# Patient Record
Sex: Male | Born: 1990 | Race: Black or African American | Hispanic: No | Smoking: Current every day smoker
Health system: Southern US, Community
[De-identification: ages and names within clinical notes are randomized; demographics above are authoritative.]

## PROBLEM LIST (undated history)

## (undated) DIAGNOSIS — F32A Depression, unspecified: Secondary | ICD-10-CM

## (undated) DIAGNOSIS — R040 Epistaxis: Secondary | ICD-10-CM

## (undated) DIAGNOSIS — F329 Major depressive disorder, single episode, unspecified: Secondary | ICD-10-CM

## (undated) DIAGNOSIS — R079 Chest pain, unspecified: Secondary | ICD-10-CM

## (undated) DIAGNOSIS — F1010 Alcohol abuse, uncomplicated: Secondary | ICD-10-CM

## (undated) HISTORY — PX: SKIN DEBRIDEMENT: SHX5235

---
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
## 2009-09-08 ENCOUNTER — Emergency Department (HOSPITAL_COMMUNITY): Admission: EM | Admit: 2009-09-08 | Discharge: 2009-09-08 | Payer: Self-pay | Admitting: Emergency Medicine

## 2012-04-23 ENCOUNTER — Emergency Department (HOSPITAL_COMMUNITY)
Admission: EM | Admit: 2012-04-23 | Discharge: 2012-04-23 | Disposition: A | Discharge status: Home / Self Care | Payer: Self-pay | Attending: Emergency Medicine | Admitting: Emergency Medicine

## 2012-04-23 ENCOUNTER — Emergency Department (HOSPITAL_COMMUNITY): Payer: Self-pay

## 2012-04-23 ENCOUNTER — Encounter (HOSPITAL_COMMUNITY): Payer: Self-pay | Admitting: *Deleted

## 2012-04-23 DIAGNOSIS — Y92410 Unspecified street and highway as the place of occurrence of the external cause: Secondary | ICD-10-CM | POA: Insufficient documentation

## 2012-04-23 DIAGNOSIS — S8990XA Unspecified injury of unspecified lower leg, initial encounter: Secondary | ICD-10-CM | POA: Insufficient documentation

## 2012-04-23 DIAGNOSIS — Y9389 Activity, other specified: Secondary | ICD-10-CM | POA: Insufficient documentation

## 2012-04-23 DIAGNOSIS — S6990XA Unspecified injury of unspecified wrist, hand and finger(s), initial encounter: Secondary | ICD-10-CM | POA: Insufficient documentation

## 2012-04-23 DIAGNOSIS — S99929A Unspecified injury of unspecified foot, initial encounter: Secondary | ICD-10-CM | POA: Insufficient documentation

## 2012-04-23 DIAGNOSIS — F1010 Alcohol abuse, uncomplicated: Secondary | ICD-10-CM | POA: Insufficient documentation
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
## 2012-07-07 ENCOUNTER — Encounter (HOSPITAL_COMMUNITY): Payer: Self-pay | Admitting: *Deleted

## 2012-07-07 ENCOUNTER — Emergency Department (HOSPITAL_COMMUNITY)
Admission: EM | Admit: 2012-07-07 | Discharge: 2012-07-07 | Disposition: A | Discharge status: Home / Self Care | Payer: Self-pay | Attending: Emergency Medicine | Admitting: Emergency Medicine

## 2012-07-07 ENCOUNTER — Emergency Department (HOSPITAL_COMMUNITY): Payer: Self-pay

## 2012-07-07 DIAGNOSIS — R071 Chest pain on breathing: Secondary | ICD-10-CM | POA: Insufficient documentation

## 2012-07-07 DIAGNOSIS — R21 Rash and other nonspecific skin eruption: Secondary | ICD-10-CM | POA: Insufficient documentation

## 2012-07-07 DIAGNOSIS — R0789 Other chest pain: Secondary | ICD-10-CM

## 2012-07-07 DIAGNOSIS — R0602 Shortness of breath: Secondary | ICD-10-CM | POA: Insufficient documentation

## 2012-07-07 DIAGNOSIS — F17200 Nicotine dependence, unspecified, uncomplicated: Secondary | ICD-10-CM | POA: Insufficient documentation

## 2012-07-07 HISTORY — DX: Chest pain, unspecified: R07.9

## 2012-07-07 HISTORY — DX: Epistaxis: R04.0

## 2012-07-07 LAB — POCT I-STAT, CHEM 8
Calcium, Ion: 1.19 mmol/L (ref 1.12–1.23)
Glucose, Bld: 144 mg/dL — ABNORMAL HIGH (ref 70–99)
HCT: 42 % (ref 39.0–52.0)
Hemoglobin: 14.3 g/dL (ref 13.0–17.0)

## 2012-07-07 LAB — CBC
HCT: 38.5 % — ABNORMAL LOW (ref 39.0–52.0)
Hemoglobin: 13.5 g/dL (ref 13.0–17.0)
MCH: 31.5 pg (ref 26.0–34.0)
MCHC: 35.1 g/dL (ref 30.0–36.0)
MCV: 90 fL (ref 78.0–100.0)
Platelets: 254 10*3/uL (ref 150–400)
RBC: 4.28 MIL/uL (ref 4.22–5.81)
RDW: 13.3 % (ref 11.5–15.5)
WBC: 11.1 10*3/uL — ABNORMAL HIGH (ref 4.0–10.5)

## 2012-07-07 LAB — POCT I-STAT TROPONIN I

## 2012-07-07 MED ORDER — DIPHENHYDRAMINE HCL 25 MG PO CAPS
25.0000 mg | ORAL_CAPSULE | Freq: Once | ORAL | Status: AC
  Administered 2012-07-07: 25 mg via ORAL
  Filled 2012-07-07: qty 1

## 2012-07-07 MED ORDER — HYDROXYZINE HCL 25 MG PO TABS: 25.0000 mg | ORAL_TABLET | Freq: Four times a day (QID) | ORAL | Status: DC

## 2012-07-07 MED ORDER — IBUPROFEN 400 MG PO TABS
600.0000 mg | ORAL_TABLET | Freq: Once | ORAL | Status: AC
  Administered 2012-07-07: 600 mg via ORAL
  Filled 2012-07-07: qty 1

## 2012-07-07 NOTE — ED Notes (Signed)
Pt with hx of chest pain "for years" to ED c/o increased pain last night.  Pain shoots from R to L or "shoots in sternum".  Felt like he was "gasping for air" last night, but today no c/o sob.  Also c/o rash/bites to forearms and ankles.  Did notice rash after sleeping at friends house last week.

## 2012-07-09 NOTE — ED Provider Notes (Signed)
History    -year-old male with chest pain. Patient has been having similar pain intermittently for the past several years. Patient states the pain "shoots across his chest." Surgeon the right parasternal border and axilla. Lasts seconds. Stenosis history with shortness of breath. No appreciable exacerbating relieving factors. No fevers or chills. No cough. No unusual leg pain or swelling. No dizziness, lightheadedness or palpitations. Denies any drug use aside from occasional marijuana. He is a smoker. Is also complaining of a pruritic rash primarily in his bilateral forearms and hands but is also noticed on his upper chest and lower back. No new exposures that he is aware of aside from significant friend's house last week. No difficulty breathing or swallowing. CSN: [id number]  Arrival date & time 07/07/12  1235   First MD Initiated Contact with Patient 07/07/12 1402      Chief Complaint  Patient presents with  . Chest Pain  . Rash    (Consider location/radiation/quality/duration/timing/severity/associated sxs/prior treatment) HPI  Past Medical History  Diagnosis Date  . Epistaxis   . Chest pain     History reviewed. No pertinent past surgical history.  No family history on file.  History  Substance Use Topics  . Smoking status: Current Every Day Smoker -- 0.25 packs/day    Types: Cigarettes  . Smokeless tobacco: Not on file  . Alcohol Use: Yes     Comment: weekend drinker      Review of Systems  All systems reviewed and negative, other than as noted in HPI.   Allergies  Review of patient's allergies indicates no known allergies.  Home Medications   Current Outpatient Rx  Name  Route  Sig  Dispense  Refill  . hydrOXYzine (ATARAX/VISTARIL) 25 MG tablet   Oral   Take 1 tablet (25 mg total) by mouth every 6 (six) hours.   12 tablet   0     BP 121/69  Pulse 77  Temp(Src) 97.8 F (36.6 C) (Oral)  Resp 18  SpO2 100%  Physical Exam  Nursing note and vitals  reviewed. Constitutional: He appears well-developed and well-nourished. No distress.  HENT:  Head: Normocephalic and atraumatic.  Eyes: Conjunctivae are normal. Right eye exhibits no discharge. Left eye exhibits no discharge.  Neck: Neck supple.  Cardiovascular: Normal rate, regular rhythm and normal heart sounds.  Exam reveals no gallop and no friction rub.   No murmur heard. Pulmonary/Chest: Effort normal and breath sounds normal. No respiratory distress. He exhibits tenderness.  Tenderness in the right parasternal border and across his mid to upper sternum. No crepitus. No concerning skin lesions.  Abdominal: Soft. He exhibits no distension. There is no tenderness.  Musculoskeletal: He exhibits no edema and no tenderness.  Lower extremities symmetric as compared to each other. No calf tenderness. Negative Homan's. No palpable cords.   Neurological: He is alert.  Skin: Skin is warm and dry.  Psychiatric: He has a normal mood and affect. His behavior is normal. Thought content normal.    ED Course  Procedures (including critical care time)  Labs Reviewed  CBC - Abnormal; Notable for the following:    WBC 11.1 (*)    HCT 38.5 (*)    All other components within normal limits  POCT I-STAT, CHEM 8 - Abnormal; Notable for the following:    BUN 27 (*)    Glucose, Bld 144 (*)    All other components within normal limits  POCT I-STAT TROPONIN I   No results found.  Dg  Chest 2 View  07/07/2012  *RADIOLOGY REPORT*  Clinical Data: Right mid chest pain, shortness of breath, dizziness and nausea  CHEST - 2 VIEW  Comparison: None.  Findings: No active infiltrate or effusion is seen. The lung fissures are somewhat more visible than generally seen and mild fluid overload cannot be excluded.  Mediastinal contours appear normal.  The heart is within normal limits in size. No bony abnormality is seen.  IMPRESSION: No definite active process.  Slightly prominent fissures.  Cannot exclude mild fluid  overload.   Original Report Authenticated By: Dwyane Dee, M.D.    EKG:  Rhythm: This rhythm with sinus arrhythmia Vent. rate 77 BPM PR interval 120 ms QRS duration 96 ms QT/QTc 360/407 ms ST segments: Specific ST change  1. Chest wall pain   2. Rash       MDM  22 year old male with chest pain. I personally reviewed the patient's EKG and chest x-ray as well as the nursing note. Suspect chest wall pain. Very atypical ACS. Doubt pulmonary embolism. Doubt infectious. Doubt dissection. Chest x-ray unremarkable. Patient counseled about smoking cessation. Also with a patchy, erythematous nonspecific rash. Pruritic. Possibly mild allergic reaction. Given his hemodynamic instability or airway compromise. Will treatment symptomatically at this time.        Raeford Razor, MD 07/09/12 1434

## 2013-05-19 ENCOUNTER — Encounter (HOSPITAL_COMMUNITY): Payer: Self-pay | Admitting: Emergency Medicine

## 2013-05-19 ENCOUNTER — Emergency Department (HOSPITAL_COMMUNITY)
Admission: EM | Admit: 2013-05-19 | Discharge: 2013-05-19 | Disposition: A | Discharge status: Home / Self Care | Payer: Self-pay | Attending: Emergency Medicine | Admitting: Emergency Medicine

## 2013-05-19 DIAGNOSIS — R51 Headache: Secondary | ICD-10-CM | POA: Insufficient documentation

## 2013-05-19 DIAGNOSIS — K0889 Other specified disorders of teeth and supporting structures: Secondary | ICD-10-CM

## 2013-05-19 DIAGNOSIS — F17200 Nicotine dependence, unspecified, uncomplicated: Secondary | ICD-10-CM | POA: Insufficient documentation

## 2013-05-19 DIAGNOSIS — K029 Dental caries, unspecified: Secondary | ICD-10-CM | POA: Insufficient documentation

## 2013-05-19 DIAGNOSIS — R519 Headache, unspecified: Secondary | ICD-10-CM

## 2013-05-19 DIAGNOSIS — Z792 Long term (current) use of antibiotics: Secondary | ICD-10-CM | POA: Insufficient documentation

## 2013-05-19 DIAGNOSIS — K089 Disorder of teeth and supporting structures, unspecified: Secondary | ICD-10-CM | POA: Insufficient documentation

## 2013-05-19 DIAGNOSIS — Z791 Long term (current) use of non-steroidal anti-inflammatories (NSAID): Secondary | ICD-10-CM | POA: Insufficient documentation

## 2013-05-19 DIAGNOSIS — R599 Enlarged lymph nodes, unspecified: Secondary | ICD-10-CM | POA: Insufficient documentation

## 2013-05-19 MED ORDER — PENICILLIN V POTASSIUM 250 MG PO TABS
500.0000 mg | ORAL_TABLET | Freq: Once | ORAL | Status: AC
Start: 2013-05-19 — End: 2013-05-19
  Administered 2013-05-19: 500 mg via ORAL
  Filled 2013-05-19: qty 2

## 2013-05-19 MED ORDER — PENICILLIN V POTASSIUM 500 MG PO TABS: 500.0000 mg | ORAL_TABLET | Freq: Four times a day (QID) | ORAL | Status: DC

## 2013-05-19 MED ORDER — TRAMADOL HCL 50 MG PO TABS: 50.0000 mg | ORAL_TABLET | Freq: Four times a day (QID) | ORAL | Status: DC | PRN

## 2013-05-19 MED ORDER — NAPROXEN 500 MG PO TABS: 500.0000 mg | ORAL_TABLET | Freq: Two times a day (BID) | ORAL | Status: DC

## 2013-05-19 MED ORDER — KETOROLAC TROMETHAMINE 60 MG/2ML IM SOLN
60.0000 mg | Freq: Once | INTRAMUSCULAR | Status: AC
  Administered 2013-05-19: 60 mg via INTRAMUSCULAR
  Filled 2013-05-19: qty 2

## 2013-05-19 NOTE — Discharge Instructions (Signed)
Headache:  You are having a headache. No specific cause was found today for your headache. It may have been a migraine or other cause of headache. Stress, anxiety, fatigue, and depression are common triggers for headaches. Your headache today does not appear to be life-threatening or require hospitalization, but often the exact cause of headaches is not determined in the emergency department. Therefore, followup with your doctor is very important to find out what may have caused your headache, and whether or not you need any further diagnostic testing or treatment. Sometimes headaches can appear benign but then more serious symptoms can develop which should prompt an immediate reevaluation by your doctor or the emergency department.  Seek immediate medical attention if:  You develop possible problems with medications prescribed. The medications don't resolve your headache, if it recurs, or if you have multiple episodes of vomiting or can't take fluids by mouth You have a change from the usual headache. If you developed a sudden severe headache or confusion, become poorly responsive or faint, developed a fever above 100.4 or problems breathing, have a change in speech, vision, swallowing or understanding, or developed new weakness, numbness, tingling, incoordination or have a seizure.  If you don't have a family doctor to follow up with, see the follow up list below - call this morning for a follow-up appointment in the next 1-2 days.  RESOURCE GUIDE  Dental Problems  Patients with Medicaid: [facility]                     [facility] [address]                                           [address] Phone:  [phone]                                                  Phone:  [phone]  If unable to pay or uninsured, contact:  [facility] or [facility] to become qualified for the adult dental clinic.  Chronic Pain Problems Contact [place]  [phone] Patients need to be referred by their primary care doctor.  Insufficient Money for Medicine Contact [employer]:  call "211" or [facility] 271-5999.  No Primary Care Doctor Call [facility]  832-8000 Other agencies that provide inexpensive medical care    [facility]  [phone]    [facility]  [phone]    [facility]  [phone]    [facility] Clinic  832-4777    [facility]  [phone]    [facility]  [phone]  Psychological Services [facility]  [phone] [facility]  [phone] [facility]   800 853-5163 (emergency services 641-4993)  Substance Abuse Resources [facility]  336-882-2125 [facility] 336-784-9470 [facility] 336-285-9073 [facility] 336-845-3988 Residential & Outpatient Substance Abuse Program  800-659-3381  Abuse/Neglect [place] Child Abuse Hotline (336) 641-3795 [place] Child Abuse Hotline 800-378-5315 (After Hours)  Emergency Shelter [facility] (336) 271-5985  [facility] (336) 275-9566 [facility] (704) 372-4663  MRSA Hotline #:     832-7006    [place] Resources  [facility] [place]     [employer]                          [facility] [address] [place]                       [address]      [address]  [place]                                                [place]                            [place] Phone:  [phone]                                   Phone:  [phone]                 Phone:  [phone]  [facility] Phone:  [phone]  [place] Child Abuse Hotline (336) 342-1394 (336) 342-3537 (After Hours)   

## 2013-05-19 NOTE — ED Notes (Signed)
Pt. C/o HA x3 days that radiates down right side of face. Pt. Denies sensitivity to light or blurred vision. Alert and oriented x4. Hx of migraines.

## 2013-05-19 NOTE — ED Provider Notes (Signed)
CSN: [id number]     Arrival date & time 05/19/13  0015 History   First MD Initiated Contact with Patient 05/19/13 [phone]     Chief Complaint  Patient presents with  . Headache   (Consider location/radiation/quality/duration/timing/severity/associated sxs/prior Treatment) HPI Comments: 23 year old male, presents with a complaint of right-sided headache for the last 3 days, this is a sharp and shooting pain that is located in his lower jaw, radiates up into the temporal muscle. Worse with palpation, worse with chewing, no difficulty with opening the jaw or swallowing or breathing. The symptoms are persistent, gradually worsening, were gradual in onset. He felt as though his lower rear molar was causing the pain, he had deep dental caries, he had his girlfriend to go to the store and place a homemade filling in that tooth. This has not helped his pain.  No vomiting, fevers or stiff neck.  Patient is a 23 y.o. male presenting with headaches. The history is provided by the patient.  Headache Associated symptoms: no drainage, no fever, no neck pain, no neck stiffness, no sinus pressure and no sore throat     Past Medical History  Diagnosis Date  . Epistaxis   . Chest pain    History reviewed. No pertinent past surgical history. No family history on file. History  Substance Use Topics  . Smoking status: Current Every Day Smoker -- 0.25 packs/day    Types: Cigarettes  . Smokeless tobacco: Not on file  . Alcohol Use: Yes     Comment: weekend drinker    Review of Systems  Constitutional: Negative for fever.  HENT: Positive for dental problem. Negative for facial swelling, postnasal drip, rhinorrhea, sinus pressure and sore throat.   Musculoskeletal: Negative for neck pain and neck stiffness.  Neurological: Positive for headaches.    Allergies  Review of patient's allergies indicates no known allergies.  Home Medications   Current Outpatient Rx  Name  Route  Sig  Dispense  Refill  .  acetaminophen (TYLENOL) 500 MG tablet   Oral   Take 1,000 mg by mouth every 6 (six) hours as needed for headache.         . naproxen (NAPROSYN) 500 MG tablet   Oral   Take 1 tablet (500 mg total) by mouth 2 (two) times daily with a meal.   30 tablet   0   . penicillin v potassium (VEETID) 500 MG tablet   Oral   Take 1 tablet (500 mg total) by mouth 4 (four) times daily.   40 tablet   0   . traMADol (ULTRAM) 50 MG tablet   Oral   Take 1 tablet (50 mg total) by mouth every 6 (six) hours as needed.   15 tablet   0    BP 127/74  Pulse 79  Temp(Src) 98.5 F (36.9 C) (Oral)  Resp 14  Ht 5\' 6"  (1.676 m)  Wt 142 lb (64.411 kg)  BMI 22.93 kg/m2  SpO2 98% Physical Exam  Nursing note and vitals reviewed. Constitutional: He appears well-developed and well-nourished. No distress.  HENT:  Head: Normocephalic and atraumatic.  Mouth/Throat: Oropharynx is clear and moist. No oropharyngeal exudate.  Right rear lower molar with significant tenderness, there is a homemade filling placed in the tooth, there is no surrounding gingival abscesses. Tympanic membrane and external auditory canal and external auricular structures are normal on the right. Reproducible tenderness to palpation over the right masseter and temporalis muscles, mild tenderness over the right mandible, no swelling  or asymmetry, no tenderness under the tongue, no elevation or protrusion of the tongue  Eyes: Conjunctivae are normal. No scleral icterus.  Neck: Normal range of motion. Neck supple. No thyromegaly present.  Cardiovascular: Normal rate and regular rhythm.   Pulmonary/Chest: Effort normal and breath sounds normal.  Lymphadenopathy:    He has cervical adenopathy (mild right-sided anterior cervical lymphadenopathy).  Neurological: He is alert.  Skin: Skin is warm and dry. No rash noted. He is not diaphoretic.    ED Course  Procedures (including critical care time) Labs Review Labs Reviewed - No data to  display Imaging Review No results found.  EKG Interpretation   None       MDM   1. Headache   2. Toothache    Overall the patient is well-appearing though he does have dental tenderness which has caused a resultant headache. He will be treated with pain medications, antibiotics, he appears stable for discharge. I doubt that this is a pathologic headache, dental followup as outpatient.   Meds given in ED:  Medications  ketorolac (TORADOL) injection 60 mg (not administered)  penicillin v potassium (VEETID) tablet 500 mg (not administered)    New Prescriptions   NAPROXEN (NAPROSYN) 500 MG TABLET    Take 1 tablet (500 mg total) by mouth 2 (two) times daily with a meal.   PENICILLIN V POTASSIUM (VEETID) 500 MG TABLET    Take 1 tablet (500 mg total) by mouth 4 (four) times daily.   TRAMADOL (ULTRAM) 50 MG TABLET    Take 1 tablet (50 mg total) by mouth every 6 (six) hours as needed.        Vida RollerBrian D Jack Bolio, MD 05/19/13 0300

## 2013-05-19 NOTE — ED Notes (Signed)
Pt. reports headache radiating to right side of face for 3 days , denies injury , no fever or chills. Alert and oriented /respirations unlabored. Denies nausea .

## 2013-07-16 ENCOUNTER — Encounter (HOSPITAL_COMMUNITY): Payer: Self-pay | Admitting: Emergency Medicine

## 2013-07-16 ENCOUNTER — Inpatient Hospital Stay (HOSPITAL_COMMUNITY)
Admission: AD | Admit: 2013-07-16 | Discharge: 2013-07-19 | DRG: 897 | Disposition: A | Discharge status: Home / Self Care | Payer: Federal, State, Local not specified - Other | Source: Intra-hospital | Attending: Psychiatry | Admitting: Psychiatry

## 2013-07-16 ENCOUNTER — Encounter (HOSPITAL_COMMUNITY): Payer: Self-pay | Admitting: *Deleted

## 2013-07-16 ENCOUNTER — Emergency Department (HOSPITAL_COMMUNITY)
Admission: EM | Admit: 2013-07-16 | Discharge: 2013-07-16 | Disposition: A | Discharge status: Other Acute Inpatient Hospital | Payer: Self-pay | Attending: Emergency Medicine | Admitting: Emergency Medicine

## 2013-07-16 DIAGNOSIS — F1210 Cannabis abuse, uncomplicated: Secondary | ICD-10-CM | POA: Diagnosis present

## 2013-07-16 DIAGNOSIS — Z791 Long term (current) use of non-steroidal anti-inflammatories (NSAID): Secondary | ICD-10-CM | POA: Insufficient documentation

## 2013-07-16 DIAGNOSIS — F3289 Other specified depressive episodes: Secondary | ICD-10-CM | POA: Diagnosis present

## 2013-07-16 DIAGNOSIS — R45850 Homicidal ideations: Secondary | ICD-10-CM | POA: Insufficient documentation

## 2013-07-16 DIAGNOSIS — R45851 Suicidal ideations: Secondary | ICD-10-CM | POA: Insufficient documentation

## 2013-07-16 DIAGNOSIS — F329 Major depressive disorder, single episode, unspecified: Secondary | ICD-10-CM | POA: Insufficient documentation

## 2013-07-16 DIAGNOSIS — F10129 Alcohol abuse with intoxication, unspecified: Secondary | ICD-10-CM

## 2013-07-16 DIAGNOSIS — Z792 Long term (current) use of antibiotics: Secondary | ICD-10-CM | POA: Insufficient documentation

## 2013-07-16 DIAGNOSIS — F332 Major depressive disorder, recurrent severe without psychotic features: Secondary | ICD-10-CM | POA: Diagnosis present

## 2013-07-16 DIAGNOSIS — F17200 Nicotine dependence, unspecified, uncomplicated: Secondary | ICD-10-CM | POA: Insufficient documentation

## 2013-07-16 DIAGNOSIS — F32A Depression, unspecified: Secondary | ICD-10-CM

## 2013-07-16 DIAGNOSIS — F1020 Alcohol dependence, uncomplicated: Principal | ICD-10-CM | POA: Diagnosis present

## 2013-07-16 HISTORY — DX: Major depressive disorder, single episode, unspecified: F32.9

## 2013-07-16 HISTORY — DX: Depression, unspecified: F32.A

## 2013-07-16 LAB — RAPID URINE DRUG SCREEN, HOSP PERFORMED
AMPHETAMINES: NOT DETECTED
BENZODIAZEPINES: NOT DETECTED
Barbiturates: NOT DETECTED
COCAINE: NOT DETECTED
OPIATES: NOT DETECTED
TETRAHYDROCANNABINOL: POSITIVE — AB

## 2013-07-16 LAB — COMPREHENSIVE METABOLIC PANEL
ALK PHOS: 65 U/L (ref 39–117)
ALT: 12 U/L (ref 0–53)
AST: 20 U/L (ref 0–37)
Albumin: 4.7 g/dL (ref 3.5–5.2)
BUN: 10 mg/dL (ref 6–23)
CALCIUM: 9.5 mg/dL (ref 8.4–10.5)
CHLORIDE: 103 meq/L (ref 96–112)
CO2: 23 mEq/L (ref 19–32)
CREATININE: 0.76 mg/dL (ref 0.50–1.35)
GFR calc Af Amer: >90 mL/min (ref >90)
GFR calc non Af Amer: >90 mL/min (ref >90)
Glucose, Bld: 92 mg/dL (ref 70–99)
Potassium: 4 mEq/L (ref 3.7–5.3)
Sodium: 143 mEq/L (ref 137–147)
Total Protein: 8.3 g/dL (ref 6.0–8.3)

## 2013-07-16 LAB — CBC
HEMATOCRIT: 43.9 % (ref 39.0–52.0)
HEMOGLOBIN: 15.5 g/dL (ref 13.0–17.0)
MCH: 32.6 pg (ref 26.0–34.0)
MCHC: 35.3 g/dL (ref 30.0–36.0)
MCV: 92.2 fL (ref 78.0–100.0)
PLATELETS: 256 10*3/uL (ref 150–400)
RBC: 4.76 MIL/uL (ref 4.22–5.81)
RDW: 13.3 % (ref 11.5–15.5)
WBC: 16.2 10*3/uL — ABNORMAL HIGH (ref 4.0–10.5)

## 2013-07-16 LAB — ACETAMINOPHEN LEVEL

## 2013-07-16 LAB — ETHANOL: Alcohol, Ethyl (B): 197 mg/dL — ABNORMAL HIGH (ref 0–11)

## 2013-07-16 LAB — SALICYLATE LEVEL

## 2013-07-16 MED ORDER — LORAZEPAM 2 MG/ML IJ SOLN: 0.0000 mg | Freq: Four times a day (QID) | INTRAMUSCULAR | Status: DC

## 2013-07-16 MED ORDER — SODIUM CHLORIDE 0.9 % IV BOLUS (SEPSIS)
1000.0000 mL | Freq: Once | INTRAVENOUS | Status: AC
  Administered 2013-07-16: 1000 mL via INTRAVENOUS

## 2013-07-16 MED ORDER — LORAZEPAM 1 MG PO TABS
0.0000 mg | ORAL_TABLET | Freq: Two times a day (BID) | ORAL | Status: DC
  Administered 2013-07-16: 1 mg via ORAL
  Filled 2013-07-16: qty 1

## 2013-07-16 MED ORDER — ADULT MULTIVITAMIN W/MINERALS CH
1.0000 | ORAL_TABLET | Freq: Every day | ORAL | Status: DC
  Administered 2013-07-16 – 2013-07-19 (×2): 1 via ORAL
  Filled 2013-07-16 (×7): qty 1

## 2013-07-16 MED ORDER — ALUM & MAG HYDROXIDE-SIMETH 200-200-20 MG/5ML PO SUSP: 30.0000 mL | ORAL | Status: DC | PRN

## 2013-07-16 MED ORDER — LOPERAMIDE HCL 2 MG PO CAPS: 2.0000 mg | ORAL_CAPSULE | ORAL | Status: DC | PRN

## 2013-07-16 MED ORDER — ACETAMINOPHEN 325 MG PO TABS
650.0000 mg | ORAL_TABLET | Freq: Once | ORAL | Status: AC
  Administered 2013-07-16: 650 mg via ORAL
  Filled 2013-07-16: qty 2

## 2013-07-16 MED ORDER — CHLORDIAZEPOXIDE HCL 25 MG PO CAPS
25.0000 mg | ORAL_CAPSULE | Freq: Four times a day (QID) | ORAL | Status: DC | PRN
Start: 2013-07-16 — End: 2013-07-19

## 2013-07-16 MED ORDER — MAGNESIUM HYDROXIDE 400 MG/5ML PO SUSP: 30.0000 mL | Freq: Every day | ORAL | Status: DC | PRN

## 2013-07-16 MED ORDER — THIAMINE HCL 100 MG/ML IJ SOLN
100.0000 mg | Freq: Once | INTRAMUSCULAR | Status: AC
  Administered 2013-07-16: 100 mg via INTRAMUSCULAR
  Filled 2013-07-16: qty 2

## 2013-07-16 MED ORDER — CHLORDIAZEPOXIDE HCL 25 MG PO CAPS: 25.0000 mg | ORAL_CAPSULE | Freq: Every day | ORAL | Status: DC

## 2013-07-16 MED ORDER — NICOTINE 21 MG/24HR TD PT24: 21.0000 mg | MEDICATED_PATCH | Freq: Once | TRANSDERMAL | Status: DC

## 2013-07-16 MED ORDER — ONDANSETRON 4 MG PO TBDP: 4.0000 mg | ORAL_TABLET | Freq: Four times a day (QID) | ORAL | Status: DC | PRN

## 2013-07-16 MED ORDER — VITAMIN B-1 100 MG PO TABS
100.0000 mg | ORAL_TABLET | Freq: Every day | ORAL | Status: DC
  Administered 2013-07-16: 100 mg via ORAL
  Filled 2013-07-16: qty 1

## 2013-07-16 MED ORDER — LORAZEPAM 1 MG PO TABS: 0.0000 mg | ORAL_TABLET | Freq: Four times a day (QID) | ORAL | Status: DC

## 2013-07-16 MED ORDER — TRAZODONE HCL 50 MG PO TABS
50.0000 mg | ORAL_TABLET | Freq: Every evening | ORAL | Status: DC | PRN
  Administered 2013-07-16 – 2013-07-18 (×3): 50 mg via ORAL
  Filled 2013-07-16 (×2): qty 1
  Filled 2013-07-16: qty 14

## 2013-07-16 MED ORDER — LORAZEPAM 2 MG/ML IJ SOLN: 0.0000 mg | Freq: Two times a day (BID) | INTRAMUSCULAR | Status: DC

## 2013-07-16 MED ORDER — CHLORDIAZEPOXIDE HCL 25 MG PO CAPS
25.0000 mg | ORAL_CAPSULE | Freq: Three times a day (TID) | ORAL | Status: AC
  Filled 2013-07-16 (×2): qty 1

## 2013-07-16 MED ORDER — CHLORDIAZEPOXIDE HCL 25 MG PO CAPS
25.0000 mg | ORAL_CAPSULE | Freq: Four times a day (QID) | ORAL | Status: AC
  Administered 2013-07-16 – 2013-07-17 (×3): 25 mg via ORAL
  Filled 2013-07-16 (×3): qty 1

## 2013-07-16 MED ORDER — HYDROXYZINE HCL 25 MG PO TABS
25.0000 mg | ORAL_TABLET | Freq: Four times a day (QID) | ORAL | Status: DC | PRN
  Administered 2013-07-16: 25 mg via ORAL
  Filled 2013-07-16: qty 1

## 2013-07-16 MED ORDER — THIAMINE HCL 100 MG/ML IJ SOLN: 100.0000 mg | Freq: Every day | INTRAMUSCULAR | Status: DC

## 2013-07-16 MED ORDER — ACETAMINOPHEN 325 MG PO TABS: 650.0000 mg | ORAL_TABLET | Freq: Four times a day (QID) | ORAL | Status: DC | PRN

## 2013-07-16 MED ORDER — CHLORDIAZEPOXIDE HCL 25 MG PO CAPS: 25.0000 mg | ORAL_CAPSULE | ORAL | Status: DC

## 2013-07-16 MED ORDER — VITAMIN B-1 100 MG PO TABS
100.0000 mg | ORAL_TABLET | Freq: Every day | ORAL | Status: DC
  Administered 2013-07-19: 100 mg via ORAL
  Filled 2013-07-16 (×5): qty 1

## 2013-07-16 MED ORDER — ALPRAZOLAM 0.5 MG PO TABS
0.5000 mg | ORAL_TABLET | Freq: Once | ORAL | Status: AC
  Administered 2013-07-16: 0.5 mg via ORAL
  Filled 2013-07-16: qty 1

## 2013-07-16 MED ORDER — ONDANSETRON HCL 4 MG/2ML IJ SOLN
4.0000 mg | Freq: Once | INTRAMUSCULAR | Status: AC
  Administered 2013-07-16: 4 mg via INTRAVENOUS
  Filled 2013-07-16: qty 2

## 2013-07-16 NOTE — Progress Notes (Signed)
23 year old male pt admitted on voluntary basis. 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 Pt endorses depression and passive SI on admission but able to contract for safety on the unit. Pt reports that he is essentially homeless and broke up with fiancee recently. Pt was oriented to the unit and safety maintained.

## 2013-07-16 NOTE — ED Notes (Signed)
Pt brought by family for suicidal and homicidal thoughts and substance abuse. His mother died and hes had these thoughts ever since. He states he has a son and needs to get better for him. Family states he drinks alcohol daily and pt states he also uses marijuana and takes pills. He is alert and tearful. Pt denies any physical complaints now.
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
## 2013-07-16 NOTE — Progress Notes (Signed)
B.Ambry Dix,MHT completed support paper work with patient and explained program rules, patient acknowledge understand. [facility] attending has been informed of disposition and will facilitate transfer.Patient has been accepted to [address]. Dub MikesLugo

## 2013-07-16 NOTE — ED Notes (Signed)
Pt began having several episodes of blood streaked emesis. ED NP aware and IV Zofran ordered. Will continue to assess.

## 2013-07-16 NOTE — ED Provider Notes (Signed)
CSN: [id number]     Arrival date & time 07/16/13  1035 History   None    Chief Complaint  Patient presents with  . Suicidal     (Consider location/radiation/quality/duration/timing/severity/associated sxs/prior Treatment) The history is provided by the patient. No language interpreter was used.  Pt is a  23 year old male who presents with his family today. He reports that he is having thoughts of suicide that has progressively gotten worse in the last 4 weeks. His mother is deceased and he has been having some depression and thoughts of suicide since that time. Patient reports that he drinks daily, a 24 pack of beer. He also reports that he smokes cigarettes and marijuana on a regular basis. He says that he has a son and wants to get help to be better for him. He however, reports that he is having some homicidal thoughts towards his son's mother. He is tearful and reports this is as bad as he as ever felt. He reports that he is not sleeping well at night.      Past Medical History  Diagnosis Date  . Epistaxis   . Chest pain    History reviewed. No pertinent past surgical history. History reviewed. No pertinent family history. History  Substance Use Topics  . Smoking status: Current Every Day Smoker -- 0.25 packs/day    Types: Cigarettes  . Smokeless tobacco: Not on file  . Alcohol Use: Yes     Comment: daily    Review of Systems  Constitutional: Negative for fever and chills.  Neurological: Negative for speech difficulty and weakness.  Psychiatric/Behavioral: Negative for behavioral problems, confusion and agitation.  All other systems reviewed and are negative.      Allergies  Review of patient's allergies indicates no known allergies.  Home Medications   Current Outpatient Rx  Name  Route  Sig  Dispense  Refill  . acetaminophen (TYLENOL) 500 MG tablet   Oral   Take 1,000 mg by mouth every 6 (six) hours as needed for headache.         . naproxen (NAPROSYN) 500  MG tablet   Oral   Take 1 tablet (500 mg total) by mouth 2 (two) times daily with a meal.   30 tablet   0   . penicillin v potassium (VEETID) 500 MG tablet   Oral   Take 1 tablet (500 mg total) by mouth 4 (four) times daily.   40 tablet   0   . traMADol (ULTRAM) 50 MG tablet   Oral   Take 1 tablet (50 mg total) by mouth every 6 (six) hours as needed.   15 tablet   0    BP 139/75  Pulse 96  Temp(Src) 98.1 F (36.7 C) (Oral)  Resp 16  Ht 5\' 4"  (1.626 m)  Wt 134 lb 12.8 oz (61.145 kg)  BMI 23.13 kg/m2  SpO2 98% Physical Exam  Nursing note and vitals reviewed. Constitutional: He is oriented to person, place, and time. He appears well-developed and well-nourished. No distress.  HENT:  Head: Normocephalic and atraumatic.  Eyes: Conjunctivae and EOM are normal. Pupils are equal, round, and reactive to light. Left eye exhibits no discharge.  Neck: Normal range of motion. Neck supple. No JVD present. No tracheal deviation present. No thyromegaly present.  Cardiovascular: Normal rate, regular rhythm, normal heart sounds and intact distal pulses.   Pulmonary/Chest: Effort normal and breath sounds normal. No respiratory distress. He has no wheezes.  Abdominal: Soft. Bowel sounds  are normal. He exhibits no distension. There is no tenderness.  Musculoskeletal: Normal range of motion.  Lymphadenopathy:    He has no cervical adenopathy.  Neurological: He is alert and oriented to person, place, and time. No cranial nerve deficit. Coordination normal.  Skin: Skin is warm and dry. No rash noted. No erythema.  Psychiatric: His speech is normal and behavior is normal. Judgment normal. Cognition and memory are normal. He exhibits a depressed mood. He expresses homicidal and suicidal ideation.  Tearful    ED Course  Procedures (including critical care time) Labs Review Labs Reviewed  CBC  COMPREHENSIVE METABOLIC PANEL  ETHANOL  ACETAMINOPHEN LEVEL  SALICYLATE LEVEL  URINE RAPID DRUG  SCREEN (HOSP PERFORMED)   Imaging Review No results found.   EKG Interpretation None     Pt became nauseous and vomited x 2 episodes. NS bolus X1 liter and IV zofran given.  1:54 PM Pt feeling better after NS bolus. No N/V for the last hour. Tolerating saltine crackers and ginger ale. Probably vomiting related to taking 3 naprosyn on an empty stomach. OK to move to pod C. Will remove IV and transfer.   MDM   Final diagnoses:  Suicidal ideation  Depression  Homicidal ideation   Pt is cooperative and desires help. Episodic vomiting, afebrile. Feeling much better after IV fluids and Zofran. Pt has history of SI and HI for the past few weeks and feels like these thoughts have been escalating. He reports that he has been depressed and has been drinking heavily. Medically cleared, TTS consulted and psych to dispo. Will IVC if needed.    3:25 PM Per psych, Has a ready bed at behavioral health. Accepted by Dr. Dub Mikes. Will transfer via [facility]. Pt is stable to transfer from ER.    Irish Elders, NP 07/16/13 1410  Irish Elders, NP 07/16/13 1526

## 2013-07-16 NOTE — ED Provider Notes (Signed)
C/o wanting to harm himself and his "baby's mama". Stating that he would stab her. He also took 3 Naprosyn tablets 2 hours prior to coming here and attempt to harm himself. He also drank a few shots of alcohol 2 hours prior to coming here. He is presently suicidal and homicidal. Patient is alert cooperative Glasgow Coma Score 15. No distress. He expresses willingness for psychiatric help and hospitalization  Doug SouSam Amalio Loe, MD 07/16/13 1521

## 2013-07-16 NOTE — ED Notes (Signed)
NP aware of V/S. Pt asymptomatic and she states he is stable for transport. Pt ambulatory to vehicle without difficulty.

## 2013-07-16 NOTE — ED Provider Notes (Signed)
Medical screening examination/treatment/procedure(s) were conducted as a shared visit with non-physician practitioner(s) and myself.  I personally evaluated the patient during the encounter.   EKG Interpretation   Date/Time:  Friday July 16 2013 11:42:14 EDT Ventricular Rate:  74 PR Interval:  119 QRS Duration: 112 QT Interval:  355 QTC Calculation: 394 R Axis:   101 Text Interpretation:  Sinus rhythm Borderline short PR interval Borderline  intraventricular conduction delay Minimal ST depression, inferior leads  Borderline ST elevation, anterolateral leads No significant change since  last tracing Confirmed by Ethelda ChickJACUBOWITZ  MD, Admiral Marcucci [phone]([id number]) on 07/16/2013  11:46:59 AM       Doug SouSam Marsel Gail, MD 07/16/13 2035
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
## 2013-07-16 NOTE — ED Notes (Signed)
Regular meal tray ordered for patient.

## 2013-07-17 ENCOUNTER — Encounter (HOSPITAL_COMMUNITY): Payer: Self-pay | Admitting: Psychiatry

## 2013-07-17 DIAGNOSIS — F10129 Alcohol abuse with intoxication, unspecified: Secondary | ICD-10-CM | POA: Diagnosis present

## 2013-07-17 DIAGNOSIS — F10229 Alcohol dependence with intoxication, unspecified: Secondary | ICD-10-CM

## 2013-07-17 DIAGNOSIS — F329 Major depressive disorder, single episode, unspecified: Secondary | ICD-10-CM

## 2013-07-17 DIAGNOSIS — F1210 Cannabis abuse, uncomplicated: Secondary | ICD-10-CM

## 2013-07-17 DIAGNOSIS — F3289 Other specified depressive episodes: Secondary | ICD-10-CM

## 2013-07-17 MED ORDER — FLUOXETINE HCL 20 MG PO CAPS
20.0000 mg | ORAL_CAPSULE | Freq: Every day | ORAL | Status: DC
  Administered 2013-07-18: 20 mg via ORAL
  Filled 2013-07-17 (×2): qty 1
  Filled 2013-07-17: qty 14
  Filled 2013-07-17 (×3): qty 1

## 2013-07-17 NOTE — Progress Notes (Signed)
D: pt calm and cooperative. Denies si/hi/avh. Pt complains of HA, feeling tired, nausea, and confused upon waking up. Pt stated he is doing ok, feeling better than he did upon admission. Pt is appropriate on approach.  A: q 15 min safety checks. Support and encouragement given. scheduled medications given R: pt remains safe on unit. No further complains or signs of distress

## 2013-07-17 NOTE — [facility] Group Notes (Signed)
[facility] Group Notes: (Clinical Social Work)   07/17/2013      Type of Therapy:  Group Therapy   Participation Level:  Did Not Attend    Jaime MantleMareida Grossman-Orr, LCSW 07/17/2013, 12:02 PM
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
## 2013-07-17 NOTE — [facility] Group Notes (Signed)
[facility] Group Notes:  (Nursing/MHT/Case Management/Adjunct)  Date:  07/17/2013  Time:  10:41 AM  Type of Therapy:  Psychoeducational Skills  Participation Level:  Did Not Attend  Summary of Progress/Problems: Pt did not attend morning group.  Jacquelyne BalintForrest, Quinlee Sciarra Shanta 07/17/2013, 10:41 AM

## 2013-07-17 NOTE — [facility] Suicide Risk Assessment (Signed)
   Nursing information obtained from:    Demographic factors:    Current Mental Status:    Loss Factors:    Historical Factors:    Risk Reduction Factors:    Total Time spent with patient: 30 minutes  CLINICAL FACTORS:   Severe Anxiety and/or Agitation Depression:   Aggression Anhedonia Comorbid alcohol abuse/dependence Hopelessness Impulsivity Insomnia Severe Alcohol/Substance Abuse/Dependencies  Psychiatric Specialty Exam: Physical Exam  Psychiatric: His speech is normal. He is agitated. Cognition and memory are normal. He expresses impulsivity. He exhibits a depressed mood. He expresses homicidal and suicidal ideation.    Review of Systems  Constitutional: Negative.   HENT: Negative.   Eyes: Negative.   Respiratory: Negative.   Cardiovascular: Negative.   Gastrointestinal: Negative.   Genitourinary: Negative.   Musculoskeletal: Negative.   Skin: Negative.   Neurological: Negative.   Endo/Heme/Allergies: Negative.   Psychiatric/Behavioral: Positive for depression, suicidal ideas and substance abuse. The patient is nervous/anxious and has insomnia.     Blood pressure 98/64, pulse 68, temperature 97.5 F (36.4 C), temperature source Oral, resp. rate 18, height 5\' 4"  (1.626 m), weight 60.328 kg (133 lb).Body mass index is 22.82 kg/(m^2).  General Appearance: Disheveled  Eye Contact::  Minimal  Speech:  Pressured  Volume:  Normal  Mood:  Depressed  Affect:  Flat  Thought Process:  Goal Directed  Orientation:  Full (Time, Place, and Person)  Thought Content:  Negative  Suicidal Thoughts:  Yes.  without intent/plan  Homicidal Thoughts:  No  Memory:  Immediate;   Fair Recent;   Fair Remote;   Fair  Judgement:  Poor  Insight:  Lacking  Psychomotor Activity:  Decreased  Concentration:  Fair  Recall:  FiservFair  Fund of Knowledge:Fair  Language: Good  Akathisia:  No  Handed:  Right  AIMS (if indicated):     Assets:  Communication Skills Desire for  Improvement Physical Health  Sleep:  Number of Hours: 6   Musculoskeletal: Strength & Muscle Tone: within normal limits Gait & Station: normal Patient leans: N/A  COGNITIVE FEATURES THAT CONTRIBUTE TO RISK:  Closed-mindedness Polarized thinking    SUICIDE RISK:   Mild:  Suicidal ideation of limited frequency, intensity, duration, and specificity.  There are no identifiable plans, no associated intent, mild dysphoria and related symptoms, good self-control (both objective and subjective assessment), few other risk factors, and identifiable protective factors, including available and accessible social support.  PLAN OF CARE:1. Admit for crisis management and stabilization. 2. Medication management to reduce current symptoms to base line and improve the     patient's overall level of functioning 3. Treat health problems as indicated. 4. Develop treatment plan to decrease risk of relapse upon discharge and the need for     readmission. 5. Psycho-social education regarding relapse prevention and self care. 6. Health care follow up as needed for medical problems. 7. Restart home medications where appropriate.   I certify that inpatient services furnished can reasonably be expected to improve the patient's condition.  Thedore MinsAkintayo, Aljean Horiuchi, MD 07/17/2013, 9:20 AM

## 2013-07-17 NOTE — Progress Notes (Signed)
D.   Pt. Had been sleeping this evening but got up and went to the cafeteria for supper.   Denies SI/HI and denies A/V hallucinations.  Pt. Reports feeling better and was in the medication room playing the piano.   Compliant with 1700 dose of Librium 25 mg. PO A.  Encouraged pt to attend group. R.  Pt. Reported that he would attend the next group.  Remains safe.

## 2013-07-17 NOTE — Progress Notes (Signed)
Psychoeducational Group Note  Date:  07/17/2013 Time:  2100 Group Topic/Focus:  wrap up group  Participation Level: Did Not Attend  Participation Quality:  Not Applicable  Affect:  Not Applicable  Cognitive:  Not Applicable  Insight:  Not Applicable  Engagement in Group: Not Applicable  Additional Comments: Pt remained in bed during group time.   Shelah LewandowskySquires, Morey Andonian Carol 07/17/2013, 10:15 PM

## 2013-07-17 NOTE — Progress Notes (Signed)
Patient ID: Glynis SmilesKevin Zuba, male   DOB: July 31, 1990, 23 y.o.   MRN: [id number]  D: Pt has been very flat and depressed on the unit today, he has been very isolative and has been in the bed all day. Pt refused all medications he reported that he was good and that he did not need anything. Pt did not attend any groups and has not engaged in treatment. Pt reported being negative SI/HI, no AH/VH noted. A: 15 min checks continued for patient safety. R: Pt safety maintained.

## 2013-07-17 NOTE — [facility] Group Notes (Signed)
[facility] Group Notes:  (Nursing/MHT/Case Management/Adjunct)  Date:  07/17/2013  Time:  1:31 PM  Type of Therapy:  Psychoeducational Skills  Participation Level:  Did Not Attend  Did not attend group and discussion on how attitude effects addiction.    Inda MerlinWilliams, Carel Carrier R 07/17/2013, 1:31 PM

## 2013-07-17 NOTE — Progress Notes (Signed)
[facility] Group Notes:  (Nursing/MHT/Case Management/Adjunct)  Date:  07/17/2013  Time:  6:20 PM  Type of Therapy:  Psychoeducational Skills  Participation Level:  Did Not Attend  Summary of Progress/Problems: Pt was in bed asleep.  Caswell CorwinOwen, Cary Lothrop C 07/17/2013, 6:20 PM

## 2013-07-18 DIAGNOSIS — F332 Major depressive disorder, recurrent severe without psychotic features: Secondary | ICD-10-CM

## 2013-07-18 NOTE — Progress Notes (Signed)
Patient did attend the evening speaker AA meeting.  

## 2013-07-18 NOTE — [facility] Group Notes (Signed)
Psychoeducational Group Note  Date:  07/18/2013 Time:  0900  Group Topic/Focus:  Orientation:   The focus of this group is to educate the patient on the purpose and policies of crisis stabilization and provide a format to answer questions about their admission.  The group details unit policies and expectations of patients while admitted.  Participation Level: Did Not Attend  Participation Quality:  Not Applicable  Affect:  Not Applicable  Cognitive:  Not Applicable  Insight:  Not Applicable  Engagement in Group: Not Applicable  Additional Comments:   Jule SerKent, Olyn Landstrom Gail 07/18/2013, 10:24 AM

## 2013-07-18 NOTE — Progress Notes (Signed)
D: Pt denies SI/HI/AVH. Pt is pleasant and cooperative. Pt stated he has  No withdrawal symptoms. Pt refused Librium  A: Pt was offered support and encouragement. Pt was given scheduled medications. Pt was encourage to attend groups. Q 15 minute checks were done for safety.   R:Pt attends groups and interacts well with peers and staff. Pt is taking medication. Pt has no complaints at this time.Pt receptive to treatment and safety maintained on unit.

## 2013-07-18 NOTE — Progress Notes (Addendum)
Patient ID: Jaime SmilesKevin Butler, male   DOB: 06-02-1990, 23 y.o.   MRN: [id number]  D: Pt continues to be very flat and depressed on the unit, he continues to be very isolative and has been in the bed all day. Pt continues to refuse all medications he reported that he was good and that he did not need anything. Pt continues to not attend any groups and has not engaged in treatment. Pt did finally do his self inventory sheet, he reported that he was ready to go home and that his depression was a 1 and his helplessness/hopelessness was a 1. Pt reported being negative SI/HI, no AH/VH noted. A: 15 min checks continued for patient safety. R: Pt safety maintained.

## 2013-07-18 NOTE — [facility] Group Notes (Signed)
[facility] Group Notes: (Clinical Social Work)   07/18/2013      Type of Therapy:  Group Therapy   Participation Level:  Did Not Attend    Ambrose MantleMareida Grossman-Orr, LCSW 07/18/2013, 12:26 PM

## 2013-07-18 NOTE — Progress Notes (Signed)
[facility]  07/18/2013 12:08 PM Jaime Butler  MRN:  [id number] Subjective:   Patient states "I need to get my mind clear. Get away from alcohol. I am going to live in [place] with my mother. Everyone around there I know is sober. I will come back to visit my son. Maybe it will work out with my fiance if I get myself together. I would say my depression is one. I'm doing better now."   Objective:  Patient assessed resting in bed this morning. He appears depressed but seems to be minimizing his symptoms. Patient reports taking medications is going well but MAR indicates that he refused Prozac. He seems to have poor insight into his depression. On admission patient reported that he drank alcohol to deal with his depression. He also reports that relationship with fiance is not good and admits to having thoughts to hurt her. Patient's long struggle with depression over the last five years was pointed out to him. He was encouraged to come back to take his Prozac to treat his depression. Patient reported being agreeable to treatment after conversation with Provider. Jaime Butler was also experiencing suicidal thoughts prior to his admission.   Diagnosis:   DSM5: Total Time spent with patient: 20 minutes AXIS I: Major depressive disorder, recurrent, severe, Alcohol abuse with intoxication, Cannabis abuse  AXIS II: Deferred  AXIS III:  Past Medical History   Diagnosis  Date   .  Epistaxis    .  Chest pain    .  Depression     AXIS IV: other psychosocial or environmental problems, problems related to social environment and problems with primary support group  AXIS V: 41-50 serious symptoms   ADL's:  Intact  Sleep: Fair  Appetite:  Fair  Suicidal Ideation:  Denies Homicidal Ideation:  Denies AEB (as evidenced by):  Psychiatric Specialty Exam: Physical Exam  Review of Systems  Constitutional: Negative.   Eyes: Negative.   Respiratory: Negative.   Cardiovascular: Negative.    Gastrointestinal: Negative.   Genitourinary: Negative.   Musculoskeletal: Negative.   Skin: Negative.   Neurological: Positive for headaches.  Endo/Heme/Allergies: Negative.   Psychiatric/Behavioral: Positive for depression and substance abuse. Negative for suicidal ideas, hallucinations and memory loss. The patient is nervous/anxious. The patient does not have insomnia.     Blood pressure 104/68, pulse 62, temperature 97.3 F (36.3 C), temperature source Oral, resp. rate 20, height 5\' 4"  (1.626 m), weight 60.328 kg (133 lb).Body mass index is 22.82 kg/(m^2).  General Appearance: Casual  Eye Contact::  Fair  Speech:  Clear and Coherent and Slow  Volume:  Decreased  Mood:  Dysphoric  Affect:  Blunt  Thought Process:  Intact  Orientation:  Full (Time, Place, and Person)  Thought Content:  WDL  Suicidal Thoughts:  No  Homicidal Thoughts:  No  Memory:  Immediate;   Good Recent;   Good Remote;   Good  Judgement:  Impaired  Insight:  Lacking  Psychomotor Activity:  Decreased  Concentration:  Fair  Recall:  Good  Fund of Knowledge:Good  Language: Good  Akathisia:  No  Handed:  Right  AIMS (if indicated):     Assets:  Communication Skills Desire for Improvement Leisure Time Physical Health Resilience Social Support  Sleep:  Number of Hours: 6   Musculoskeletal: Strength & Muscle Tone: within normal limits Gait & Station: normal Patient leans: N/A  Current Medications: Current Facility-Administered Medications  Medication Dose Route Frequency Provider Last Rate Last Dose  .  acetaminophen (TYLENOL) tablet 650 mg  650 mg Oral Q6H PRN Cleotis Nipper, MD      . alum & mag hydroxide-simeth (MAALOX/MYLANTA) 200-200-20 MG/5ML suspension 30 mL  30 mL Oral Q4H PRN Cleotis Nipper, MD      . chlordiazePOXIDE (LIBRIUM) capsule 25 mg  25 mg Oral Q6H PRN Cleotis Nipper, MD      . chlordiazePOXIDE (LIBRIUM) capsule 25 mg  25 mg Oral TID Cleotis Nipper, MD       Followed by  . [START ON  07/19/2013] chlordiazePOXIDE (LIBRIUM) capsule 25 mg  25 mg Oral BH-qamhs Cleotis Nipper, MD       Followed by  . [START ON 07/21/2013] chlordiazePOXIDE (LIBRIUM) capsule 25 mg  25 mg Oral Daily Cleotis Nipper, MD      . FLUoxetine (PROZAC) capsule 20 mg  20 mg Oral Daily Shyah Cadmus      . hydrOXYzine (ATARAX/VISTARIL) tablet 25 mg  25 mg Oral Q6H PRN Cleotis Nipper, MD   25 mg at 07/16/13 2245  . loperamide (IMODIUM) capsule 2-4 mg  2-4 mg Oral PRN Cleotis Nipper, MD      . magnesium hydroxide (MILK OF MAGNESIA) suspension 30 mL  30 mL Oral Daily PRN Cleotis Nipper, MD      . multivitamin with minerals tablet 1 tablet  1 tablet Oral Daily Cleotis Nipper, MD   1 tablet at 07/16/13 2004  . ondansetron (ZOFRAN-ODT) disintegrating tablet 4 mg  4 mg Oral Q6H PRN Cleotis Nipper, MD      . thiamine (VITAMIN B-1) tablet 100 mg  100 mg Oral Daily Cleotis Nipper, MD      . traZODone (DESYREL) tablet 50 mg  50 mg Oral QHS PRN Kristeen Mans, NP   50 mg at 07/17/13 2113    Lab Results: No results found for this or any previous visit (from the past 48 hour(s)).  Physical Findings: AIMS: Facial and Oral Movements Muscles of Facial Expression: None, normal Lips and Perioral Area: None, normal Jaw: None, normal Tongue: None, normal,Extremity Movements Upper (arms, wrists, hands, fingers): None, normal Lower (legs, knees, ankles, toes): None, normal, Trunk Movements Neck, shoulders, hips: None, normal, Overall Severity Severity of abnormal movements (highest score from questions above): None, normal Incapacitation due to abnormal movements: None, normal Patient's awareness of abnormal movements (rate only patient's report): No Awareness, Dental Status Current problems with teeth and/or dentures?: No Does patient usually wear dentures?: No  CIWA:  CIWA-Ar Total: 0 COWS:     Treatment Plan Summary: Daily contact with patient to assess and evaluate symptoms and progress in treatment Medication  management  Plan: Continue crisis management and stabilization.  Medication management: Continue Librium protocol for alcohol detox, Prozac 20 mg for major depression.  Encouraged patient to attend groups and participate in group counseling sessions and activities.  Discharge plan in progress.  Continue current treatment plan.  Address health issues: Vitals reviewed and stable.   Medical Decision Making Problem Points:  Established problem, stable/improving (1) and Review of psycho-social stressors (1) Data Points:  Review of medication regiment & side effects (2) Review of new medications or change in dosage (2)  I certify that inpatient services furnished can reasonably be expected to improve the patient's condition.   Fransisca Kaufmann NP-C 07/18/2013, 12:08 PM  Patient seen, evaluated and I agree with notes by Nurse Practitioner. Thedore Mins, MD

## 2013-07-18 NOTE — Progress Notes (Signed)
[facility] Group Notes:  (Nursing/MHT/Case Management/Adjunct)  Date:  07/18/2013  Time:  6:18 PM  Type of Therapy:  Psychoeducational Skills  Participation Level:  Did Not Attend  Summary of Progress/Problems: Pt was in bed at the time of group.  Zynia Wojtowicz C 07/18/2013, 6:18 PM 

## 2013-07-18 NOTE — Progress Notes (Signed)
  D: Pt observed sleeping in bed with eyes closed. RR even and unlabored. No distress noted  .  A: Q 15 minute checks were done for safety.  R: safety maintained on unit.  

## 2013-07-18 NOTE — [facility] Group Notes (Signed)
[facility] Group Notes:  (Nursing/MHT/Case Management/Adjunct)  Date:  07/18/2013  Time:  4:52 PM  Type of Therapy:  Nurse Education  Participation Level:  Active  Participation Quality:  Appropriate, Attentive, Sharing and Supportive  Affect:  Appropriate  Cognitive:  Alert and Appropriate  Insight:  Improving  Engagement in Group:  Developing/Improving and Supportive  Modes of Intervention:  Discussion, Education, Exploration and Support  Summary of Progress/Problems: Client identified reasons to stay sober; for example, his son, "I don't want my son to grow up with a drunk father." He expressed himself well in group and provided good insight for the group.   Joyce GrossKay, Trinady Milewski Joy 07/18/2013, 4:52 PM

## 2013-07-19 DIAGNOSIS — F1020 Alcohol dependence, uncomplicated: Principal | ICD-10-CM

## 2013-07-19 MED ORDER — ALUM & MAG HYDROXIDE-SIMETH 200-200-20 MG/5ML PO SUSP: 30.0000 mL | ORAL | Status: DC | PRN

## 2013-07-19 MED ORDER — TRAZODONE HCL 50 MG PO TABS: 50.0000 mg | ORAL_TABLET | Freq: Every evening | ORAL | Status: DC | PRN

## 2013-07-19 MED ORDER — CHLORDIAZEPOXIDE HCL 25 MG PO CAPS
ORAL_CAPSULE | ORAL | Status: AC
  Filled 2013-07-19: qty 1

## 2013-07-19 MED ORDER — ACETAMINOPHEN 325 MG PO TABS: 650.0000 mg | ORAL_TABLET | Freq: Four times a day (QID) | ORAL | Status: DC | PRN

## 2013-07-19 MED ORDER — MAGNESIUM HYDROXIDE 400 MG/5ML PO SUSP: 30.0000 mL | Freq: Every day | ORAL | Status: DC | PRN

## 2013-07-19 MED ORDER — FLUOXETINE HCL 20 MG PO CAPS: 20.0000 mg | ORAL_CAPSULE | Freq: Every day | ORAL | Status: DC

## 2013-07-19 NOTE — Progress Notes (Signed)
Patient ID: Jaime SmilesKevin Mehra, male   DOB: 07-18-1990, 23 y.o.   MRN: [id number] He has been up and about and to groups interacting with peers and staff. Self inventory: depressed and hopeless at 1. Denies SI thoughts. Stated that he wants to go home today that he was having no withdrawal symptoms.

## 2013-07-19 NOTE — Discharge Summary (Signed)
Physician Discharge Summary Note  Patient:  Jaime Butler is an 23 y.o., male MRN:  [id number] DOB:  Jul 28, 1990 Patient phone:  [phone] (home)  Patient address:   [place] [address],  Total Time spent with patient: Greater than 30 minutes  Date of Admission:  07/16/2013 Date of Discharge: 07/19/13  Reason for Admission:  Alcohol detox  Discharge Diagnoses: Principal Problem:   Alcohol abuse with intoxication Active Problems:   Depressive disorder, not elsewhere classified   Recurrent major depression-severe   Alcohol dependence   Psychiatric Specialty Exam: Physical Exam  Constitutional: He is oriented to person, place, and time. He appears well-developed.  HENT:  Head: Normocephalic.  Eyes: Pupils are equal, round, and reactive to light.  Neck: Normal range of motion.  Cardiovascular: Normal rate.   Respiratory: Effort normal.  GI: Soft.  Genitourinary:  Denies any issues in this areas  Musculoskeletal: Normal range of motion.  Neurological: He is alert and oriented to person, place, and time.  Skin: Skin is warm and dry.  Psychiatric: His speech is normal and behavior is normal. Judgment and thought content normal. His mood appears not anxious. His affect is not angry, not blunt, not labile and not inappropriate. Cognition and memory are normal. He does not exhibit a depressed mood.    Review of Systems  Constitutional: Negative.   HENT: Negative.   Eyes: Negative.   Respiratory: Negative.   Cardiovascular: Negative.   Gastrointestinal: Negative.   Genitourinary: Negative.   Musculoskeletal: Negative.   Skin: Negative.   Neurological: Negative.   Endo/Heme/Allergies: Negative.   Psychiatric/Behavioral: Positive for depression (Stabi;lized with mediocation prior to discharge) and substance abuse (Alcoholism, chronic). Negative for suicidal ideas, hallucinations and memory loss. The patient has insomnia (Stabilized with medication  prior to discharge). The patient is not nervous/anxious.     Blood pressure 114/72, pulse 56, temperature 97.7 F (36.5 C), temperature source Oral, resp. rate 16, height $RemoveBe'5\' 4"'YDlIrsXVN$  (1.626 m), weight 60.328 kg (133 lb).Body mass index is 22.82 kg/(m^2).  General Appearance: Casual and Fairly Groomed  Engineer, water::  Good  Speech:  Clear and Coherent  Volume:  Normal  Mood:  Stable  Affect:  Appropriate and Congruent  Thought Process:  Coherent and Goal Directed  Orientation:  Full (Time, Place, and Person)  Thought Content:  Denies any hallucinations, delusions, paranoia  Suicidal Thoughts:  No  Homicidal Thoughts:  No  Memory:  Immediate;   Good Recent;   Good Remote;   Good  Judgement:  Good  Insight:  Present  Psychomotor Activity:  Normal  Concentration:  Good  Recall:  Good  Fund of Knowledge:Fair  Language: Good  Akathisia:  No  Handed:  Right  AIMS (if indicated):     Assets:  Desire for Improvement  Sleep:  Number of Hours: 4.25    Past Psychiatric History: Diagnosis: Alcohol related disorder, moderate, Alcohol Related Disorder - Moderate (303.90)  Hospitalizations: [facility] adult unit  Outpatient Care: Declines appointment  Substance Abuse Care: Declines  Self-Mutilation: Denies  Suicidal Attempts: "Yes, by overdose in the past"  Violent Behaviors: Denies   Musculoskeletal: Strength & Muscle Tone: within normal limits Gait & Station: normal Patient leans: N/A  DSM5:  Schizophrenia Disorders:  NA Obsessive-Compulsive Disorders:  NA Trauma-Stressor Disorders:  NA Substance/Addictive Disorders:  Alcohol Related Disorder - Moderate (303.90) Depressive Disorders:  Major Depressive Disorder - Severe (296.23)  Axis Diagnosis:   AXIS I:  Alcohol related disorder, moderate, Alcohol  Related Disorder - Moderate (303.90) AXIS II:  Deferred AXIS III:   Past Medical History  Diagnosis Date  . Epistaxis   . Chest pain   . Depression    AXIS IV:  problems related to  legal system/crime and Alcoholism, chronic AXIS V:  62  Level of Care:  OP  Hospital Course:  Jaime Butler is a 23 year old male who presented voluntarily with family to [facility] ER requesting detox from alcohol. Patient reported that he has been drinking a case of beer daily. On arrival to [facility] the patient was having serious withdrawal symptoms as he threw up during his initial tele assessment with counselor. Patient states today during his assessment "I lost my mother in 2010. Tried to overdose after that with a few pills. Then I started drinking regularly to cope. Have been drinking about a case of beer every day. I have been very depressed. My stepmother brought me. I was starting to feel out of it. Had thoughts to overdose on naproxen. I have been having trouble with my fiance. We broke up three months ago.   Kainoah was admitted to the hospital with blood alcohol level of 197 and positive THC. Kyel has hx of major depression with suicide attempt by overdose. He came to the hospital intoxicated, requiring detoxification treatment. While a patient in this hospital, Zaeem was ordered and received Librium detoxification treatment protocols. He was also enrolled in group counseling sessions including AA/NA meetings being offered and held on this unit. He learned coping skills that should help him cope better after discharge.  Besides the detoxification treatment, Siddhant also received Fluoxetine 20 mg daily for depression and Trazodone 50 mg Q bedtime for sleep. He presented no other medical issues that required treatment. Rafael tolerated his treatment regimen without any adverse effects and reactions reported. Vanden has completed detox and his mood stabilized. This is evidenced by his reports of improved mood and absence of withdrawal symptoms. He is ready to continue further substance abuse treatment on an outpatient basis. However, Nazier declines to have a follow-up appointment set up for him. He says he  is packing up his belongings after discharge and will head back to [place]. He has been instructed and encouraged to seek both routine psychiatric care, medication management once he gets to [place].  Upon discharge, Talis adamantly denies any SIHI, AVH, delusional thoughts, paranoia ad or withdrawal symptoms. He received from [facility] 14 days worth, supply samples of his [facility] discharge medication. He left [facility] with all personal belongings in no apparent distress. Transportation per friend.  Consults:  psychiatry  Significant Diagnostic Studies:  labs: CBC with diff, CMP, UDS, toxicology tests, U/A  Discharge Vitals:   Blood pressure 114/72, pulse 56, temperature 97.7 F (36.5 C), temperature source Oral, resp. rate 16, height $RemoveBe'5\' 4"'YNCywnoCC$  (1.626 m), weight 60.328 kg (133 lb). Body mass index is 22.82 kg/(m^2). Lab Results:   Results for orders placed during the hospital encounter of 07/16/13 (from the past 72 hour(s))  CBC     Status: Abnormal   Collection Time    07/16/13 10:57 AM      Result Value Ref Range   WBC 16.2 (*) 4.0 - 10.5 K/uL   RBC 4.76  4.22 - 5.81 MIL/uL   Hemoglobin 15.5  13.0 - 17.0 g/dL   HCT 43.9  39.0 - 52.0 %   MCV 92.2  78.0 - 100.0 fL   MCH 32.6  26.0 - 34.0 pg   MCHC  35.3  30.0 - 36.0 g/dL   RDW 13.3  11.5 - 15.5 %   Platelets 256  150 - 400 K/uL  COMPREHENSIVE METABOLIC PANEL     Status: Abnormal   Collection Time    07/16/13 10:57 AM      Result Value Ref Range   Sodium 143  137 - 147 mEq/L   Potassium 4.0  3.7 - 5.3 mEq/L   Chloride 103  96 - 112 mEq/L   CO2 23  19 - 32 mEq/L   Glucose, Bld 92  70 - 99 mg/dL   BUN 10  6 - 23 mg/dL   Creatinine, Ser 0.76  0.50 - 1.35 mg/dL   Calcium 9.5  8.4 - 10.5 mg/dL   Total Protein 8.3  6.0 - 8.3 g/dL   Albumin 4.7  3.5 - 5.2 g/dL   AST 20  0 - 37 U/L   ALT 12  0 - 53 U/L   Alkaline Phosphatase 65  39 - 117 U/L   Total Bilirubin <0.2 (*) 0.3 - 1.2 mg/dL   GFR calc non Af Amer >90  >90 mL/min   GFR calc Af Amer >90   >90 mL/min   Comment: (NOTE)     The eGFR has been calculated using the CKD EPI equation.     This calculation has not been validated in all clinical situations.     eGFR's persistently <90 mL/min signify possible Chronic Kidney     Disease.  ETHANOL     Status: Abnormal   Collection Time    07/16/13 10:57 AM      Result Value Ref Range   Alcohol, Ethyl (B) 197 (*) 0 - 11 mg/dL   Comment:            LOWEST DETECTABLE LIMIT FOR     SERUM ALCOHOL IS 11 mg/dL     FOR MEDICAL PURPOSES ONLY  ACETAMINOPHEN LEVEL     Status: None   Collection Time    07/16/13 10:57 AM      Result Value Ref Range   Acetaminophen (Tylenol), Serum <15.0  10 - 30 ug/mL   Comment:            THERAPEUTIC CONCENTRATIONS VARY     SIGNIFICANTLY. A RANGE OF 10-30     ug/mL MAY BE AN EFFECTIVE     CONCENTRATION FOR MANY PATIENTS.     HOWEVER, SOME ARE BEST TREATED     AT CONCENTRATIONS OUTSIDE THIS     RANGE.     ACETAMINOPHEN CONCENTRATIONS     >150 ug/mL AT 4 HOURS AFTER     INGESTION AND >50 ug/mL AT 12     HOURS AFTER INGESTION ARE     OFTEN ASSOCIATED WITH TOXIC     REACTIONS.  SALICYLATE LEVEL     Status: Abnormal   Collection Time    07/16/13 10:57 AM      Result Value Ref Range   Salicylate Lvl <3.7 (*) 2.8 - 20.0 mg/dL  URINE RAPID DRUG SCREEN (HOSP PERFORMED)     Status: Abnormal   Collection Time    07/16/13 11:00 AM      Result Value Ref Range   Opiates NONE DETECTED  NONE DETECTED   Cocaine NONE DETECTED  NONE DETECTED   Benzodiazepines NONE DETECTED  NONE DETECTED   Amphetamines NONE DETECTED  NONE DETECTED   Tetrahydrocannabinol POSITIVE (*) NONE DETECTED   Barbiturates NONE DETECTED  NONE DETECTED   Comment:  DRUG SCREEN FOR MEDICAL PURPOSES     ONLY.  IF CONFIRMATION IS NEEDED     FOR ANY PURPOSE, NOTIFY LAB     WITHIN 5 DAYS.                LOWEST DETECTABLE LIMITS     FOR URINE DRUG SCREEN     Drug Class       Cutoff (ng/mL)     Amphetamine      1000      Barbiturate      200     Benzodiazepine   622     Tricyclics       297     Opiates          300     Cocaine          300     THC              50    Physical Findings: AIMS: Facial and Oral Movements Muscles of Facial Expression: None, normal Lips and Perioral Area: None, normal Jaw: None, normal Tongue: None, normal,Extremity Movements Upper (arms, wrists, hands, fingers): None, normal Lower (legs, knees, ankles, toes): None, normal, Trunk Movements Neck, shoulders, hips: None, normal, Overall Severity Severity of abnormal movements (highest score from questions above): None, normal Incapacitation due to abnormal movements: None, normal Patient's awareness of abnormal movements (rate only patient's report): No Awareness, Dental Status Current problems with teeth and/or dentures?: No Does patient usually wear dentures?: No  CIWA:  CIWA-Ar Total: 0 COWS:     Psychiatric Specialty Exam: See Psychiatric Specialty Exam and Suicide Risk Assessment completed by Attending Physician prior to discharge.  Discharge destination:  Home  Is patient on multiple antipsychotic therapies at discharge:  No   Has Patient had three or more failed trials of antipsychotic monotherapy by history:  No  Recommended Plan for Multiple Antipsychotic Therapies: NA     Medication List    STOP taking these medications       traMADol 50 MG tablet  Commonly known as:  ULTRAM      TAKE these medications     Indication   FLUoxetine 20 MG capsule  Commonly known as:  PROZAC  Take 1 capsule (20 mg total) by mouth daily. For depression   Indication:  Major Depressive Disorder     traZODone 50 MG tablet  Commonly known as:  DESYREL  Take 1 tablet (50 mg total) by mouth at bedtime as needed for sleep.   Indication:  Trouble Sleeping       Follow-up recommendations:  Activity:  As tolerated Diet: As recommended by your primary care doctor. Keep all scheduled follow-up appointments as  recommended.  Comments:  Take all your medications as prescribed by your mental healthcare provider. Report any adverse effects and or reactions from your medicines to your outpatient provider promptly. Patient is instructed and cautioned to not engage in alcohol and or illegal drug use while on prescription medicines. In the event of worsening symptoms, patient is instructed to call the crisis hotline, 911 and or go to the nearest ED for appropriate evaluation and treatment of symptoms. Follow-up with your primary care provider for your other medical issues, concerns and or health care needs.  Total Discharge Time:  Greater than 30 minutes.  Signed: Encarnacion Slates, PMHNP-BC, FNP 07/19/2013, 9:36 AM Personally evaluated the patient and agree with assessment and plan Geralyn Flash A. Sabra Heck, M.D.

## 2013-07-19 NOTE — Progress Notes (Signed)
Patient ID: Jaime SmilesKevin Minchew, male   DOB: 18-Jul-1990, 23 y.o.   MRN: [id number] Patient was discharged ambulatory.  He denies SI/HI.  He verbalizes understanding of hisdischarge meds and followup.  He was given a supply of meds and scripts by MD.  He says he knows he needs to stick to his plan to stay clean and that stress, anger and frustration are triggers.  Patient in good mood.

## 2013-07-19 NOTE — Progress Notes (Signed)
Patient ID: Jaime SmilesKevin Elmquist, male   DOB: [birth year], 23 y.o.   MRN: [id number] He has been discharged home and was picked up by his mother. He voiced understanding of discharge instruction and of follow up instruction. He denies thoughts of SI and all his belonging were taken home with him.

## 2013-07-19 NOTE — [facility] Suicide Risk Assessment (Signed)
Suicide Risk Assessment  Discharge Assessment     Demographic Factors:  Male and Adolescent or young adult  Total Time spent with patient: 45 minutes  Psychiatric Specialty Exam:     Blood pressure 114/72, pulse 56, temperature 97.7 F (36.5 C), temperature source Oral, resp. rate 16, height 5\' 4"  (1.626 m), weight 60.328 kg (133 lb).Body mass index is 22.82 kg/(m^2).  General Appearance: Fairly Groomed  Patent attorneyye Contact::  Fair  Speech:  Clear and Coherent  Volume:  Normal  Mood:  Euthymic  Affect:  Appropriate  Thought Process:  Coherent and Goal Directed  Orientation:  Full (Time, Place, and Person)  Thought Content:  relapse prevention plan  Suicidal Thoughts:  No  Homicidal Thoughts:  No  Memory:  Immediate;   Fair Recent;   Fair Remote;   Fair  Judgement:  Fair  Insight:  Present  Psychomotor Activity:  Normal  Concentration:  Fair  Recall:  FiservFair  Fund of Knowledge:Fair  Language: Fair  Akathisia:  No  Handed:  Right  AIMS (if indicated):     Assets:  Desire for Improvement Physical Health Social Support Talents/Skills Transportation Vocational/Educational  Sleep:  Number of Hours: 4.25    Musculoskeletal: Strength & Muscle Tone: within normal limits Gait & Station: normal Patient leans: N/A   Mental Status Per Nursing Assessment::   On Admission:     Current Mental Status by Physician: In full contact with reality. There are no active S/S of withdrawal   Loss Factors: NA  Historical Factors: NA  Risk Reduction Factors:   Responsible for children under 25 years of age, Employed, Living with another person, especially a relative and Positive social support  Continued Clinical Symptoms:  Depression:   Comorbid alcohol abuse/dependence Alcohol/Substance Abuse/Dependencies  Cognitive Features That Contribute To Risk:  Polarized thinking Thought constriction (tunnel vision)    Suicide Risk:  Minimal: No identifiable suicidal ideation.  Patients  presenting with no risk factors but with morbid ruminations; may be classified as minimal risk based on the severity of the depressive symptoms  Discharge Diagnoses:   AXIS I:  Alcohol Dependence, Major Depression  AXIS II:  Deferred AXIS III:   Past Medical History  Diagnosis Date  . Epistaxis   . Chest pain   . Depression    AXIS IV:  other psychosocial or environmental problems AXIS V:  61-70 mild symptoms  Plan Of Care/Follow-up recommendations:  Activity:  as tolerated Diet:  regular Follow up AA Is patient on multiple antipsychotic therapies at discharge:  No   Has Patient had three or more failed trials of antipsychotic monotherapy by history:  No  Recommended Plan for Multiple Antipsychotic Therapies: NA    Roger Fasnacht A 07/19/2013, 2:05 PM

## 2013-07-19 NOTE — Progress Notes (Signed)
Adult Psychoeducational Group Note  Date:  07/19/2013 Time:  10:00 am  Group Topic/Focus:  Wellness Toolbox:   The focus of this group is to discuss various aspects of wellness, balancing those aspects and exploring ways to increase the ability to experience wellness.  Patients will create a wellness toolbox for use upon discharge.  Participation Level:  Active  Participation Quality:  Appropriate, Sharing and Supportive  Affect:  Appropriate  Cognitive:  Appropriate  Insight: Appropriate  Engagement in Group:  Engaged  Modes of Intervention:  Discussion, Education, Socialization and Support  Additional Comments:  Pt stated that by going to church and playing music that he can promote health and wellness in his life. Pt stated that he also needs to stop using so many excuses. Pt identified grief and a lack of will power as barriers to promoting health and wellness in his life.   Jaime Butler 07/19/2013, 10:34 AM

## 2013-07-19 NOTE — [facility] Group Notes (Addendum)
[facility]   07/19/2013 9:48 AM  Participation Quality:  Appropriate   Mood/Affect:  Appropriate  Depression Rating:  0  Anxiety Rating:  4  Thoughts of Suicide:  No Will you contract for safety?   NA  Current AVH:  No  Plan for Discharge/Comments:  Pt stated that he plans to return to his family's [place] in [place]. He shared that his family is supportive of him being in recovery. He plans to attend AA meetings and stated that his family would take care of any referrals for med management if needed. Pt stated that he did not want followup to be made on his behalf by CSW. Pt pleasant and friendly during group. Reports no withdrawals.   Transportation Means: friend   Supports: friend, fiance, family   Smart, Jaime Butler LCSWA

## 2013-07-19 NOTE — [facility] Suicide Risk Assessment (Signed)
[facility] INPATIENT:  Family/Significant Other Suicide Prevention Education  Suicide Prevention Education:  Patient Refusal for Family/Significant Other Suicide Prevention Education: The patient Jaime Butler has refused to provide written consent for family/significant other to be provided Family/Significant Other Suicide Prevention Education during admission and/or prior to discharge.  Physician notified. Pt did not consent to allowing CSW to complete SPE with family. Pt denies SI/HI and states that he does not experience SI when he is sober. Pt denies access to guns/firearms, etc. And stated that he feels good about his plan to go back to [place], [place] to spend time with his family who are supportive of him. Pt plans to attend AA. Pt provided with SPI pamphlet and encouraged to share information with support network, ask questions, and talk about any concerns relating to SPE. Pt pleasant and cooperative during individual session.   Smart, Taryn Shellhammer LCSWA  07/19/2013, 10:12 AM

## 2013-07-19 NOTE — Progress Notes (Signed)
[facility] :  Will you be returning to the same living situation after discharge: No, pt plans to return to [place] with family. At discharge, do you have transportation home?:Yes,  friend Do you have the ability to pay for your medications:Yes,  Med Pay  Release of information consent forms completed and submitted to Medical Records by CSW.  Patient to Follow up at: Follow-up Information   Follow up with Patient refused follow-up referral. Caryn Bee(Milos, please work with your family to secure follow-up when you return to [place] (psychiatrist for medication management and therapist if needed). .    Pt plans to attend AA meetings in [place] and stated that if needed, his family will assist him with followup for med management.   Patient denies SI/HI:   Yes,  during group/self report.    Safety Planning and Suicide Prevention discussed:  Yes,  SPE completed with pt. SPI pamphlet provided to pt and he was encouraged to share information with support network, ask questions, and talk about any concerns relating to SPE.  Smart, Petr Bontempo LCSWA  07/19/2013, 10:22 AM

## 2013-07-19 NOTE — Tx Team (Signed)
Interdisciplinary Treatment Plan Update (Adult)  Date: 07/19/2013   Time Reviewed: 10:16 AM  Progress in Treatment:  Attending groups: Yes  Participating in groups:Yes    Taking medication as prescribed: Yes  Tolerating medication: Yes  Family/Significant othe contact made: No. Pt refused to consent to family contact for SPE. SPE completed with pt.   Patient understands diagnosis: Yes, AEB seeking treatment for ETOH detox and mood stabilization.  Discussing patient identified problems/goals with staff: Yes  Medical problems stabilized or resolved: Yes  Denies suicidal/homicidal ideation: Yes during group, self report.  Patient has not harmed self or Others: Yes  New problem(s) identified:  Discharge Plan or Barriers: Pt plans to [place] with his family. Pt plans to attend AA meetings and requested that CSW allow him and his family to make any followup appts if necessary. Pt's friend will pick him up and transport him home today. Additional comments: symptoms as he threw up during his initial tele assessment with counselor. Patient states today during his assessment "I lost my mother in 2010. Tried to overdose after that with a few pills. Then I started drinking regularly to cope. Have been drinking about a case of beer every day. I have been very depressed. My stepmother brought me. I was starting to feel out of it. Had thoughts to overdose on naproxen. I have been having trouble with my fiance. We broke up three months ago. She has not been letting me see my 42six month old son. I had thoughts of wanting to choke her. But I'm working to get those out of my mind. I have been depressed. When I feel that way I drink to forget. It ends up bringing my life down even more. I have also been smoking marijuana. But I don't see that as a problem."  Reason for Continuation of Hospitalization: d/c today  Estimated length of stay: d/c today For review of initial/current patient goals, please see  plan of care.  Attendees:  Patient:    Family:    Physician: Geoffery LyonsIrving Lugo MD 07/19/2013 10:16 AM   Nursing: Lupita Leashonna RN 07/19/2013 10:16 AM   Clinical Social Worker Keia Rask Smart, LCSWA  07/19/2013 10:16 AM   Other: Chandra BatchAggie N. PA  07/19/2013 10:16 AM   Other:    Other: Darden DatesJennifer C. Nurse CM 07/19/2013 10:16 AM   Other:    Scribe for Treatment Team:  The Sherwin-WilliamsHeather Smart LCSWA 07/19/2013 10:16 AM

## 2013-07-26 NOTE — Progress Notes (Signed)
Patient Discharge Instructions:  No documentation was faxed for HBIPS.  Per the SW the patient refused follow up.  Jerelene ReddenSheena E Galva, 07/26/2013, 2:27 PM

## 2013-10-01 ENCOUNTER — Encounter (HOSPITAL_COMMUNITY): Payer: Self-pay | Admitting: Emergency Medicine

## 2013-10-01 ENCOUNTER — Emergency Department (HOSPITAL_COMMUNITY)
Admission: EM | Admit: 2013-10-01 | Discharge: 2013-10-01 | Discharge status: Against Medical Advice | Payer: No Typology Code available for payment source | Attending: Emergency Medicine | Admitting: Emergency Medicine

## 2013-10-01 DIAGNOSIS — R32 Unspecified urinary incontinence: Secondary | ICD-10-CM | POA: Insufficient documentation

## 2013-10-01 DIAGNOSIS — R300 Dysuria: Secondary | ICD-10-CM | POA: Insufficient documentation

## 2013-10-01 DIAGNOSIS — F17200 Nicotine dependence, unspecified, uncomplicated: Secondary | ICD-10-CM | POA: Insufficient documentation

## 2013-10-01 DIAGNOSIS — R319 Hematuria, unspecified: Secondary | ICD-10-CM | POA: Insufficient documentation

## 2013-10-01 LAB — URINALYSIS, ROUTINE W REFLEX MICROSCOPIC
Bilirubin Urine: NEGATIVE
Glucose, UA: NEGATIVE mg/dL
Hgb urine dipstick: NEGATIVE
Ketones, ur: NEGATIVE mg/dL
Leukocytes, UA: NEGATIVE
NITRITE: NEGATIVE
PH: 6 (ref 5.0–8.0)
Protein, ur: NEGATIVE mg/dL
SPECIFIC GRAVITY, URINE: 1.019 (ref 1.005–1.030)
Urobilinogen, UA: 1 mg/dL (ref 0.0–1.0)

## 2013-10-01 NOTE — ED Notes (Signed)
Unable to locate pt in waiting room or immediate area; pt eloped from ER after triage

## 2013-10-01 NOTE — ED Notes (Signed)
Pt not in triage room 5; pt not in waiting room; unable to locate pt.

## 2013-10-01 NOTE — ED Notes (Signed)
Per EMS pt states he was woke up because he was incontinent of urine that had blood in it and states he has vomited a small amt of blood  Pt has no hx of kidney stones but has a family hx of them  Pt c/o pain with urination since   Pt is pain free upon arrival

## 2013-10-01 NOTE — ED Notes (Signed)
PT not in waiting room; unable to locate pt in waiting room or immediate area

## 2013-10-19 ENCOUNTER — Emergency Department (HOSPITAL_COMMUNITY)
Admission: EM | Admit: 2013-10-19 | Discharge: 2013-10-19 | Disposition: A | Discharge status: Home / Self Care | Payer: Self-pay | Attending: Emergency Medicine | Admitting: Emergency Medicine

## 2013-10-19 ENCOUNTER — Emergency Department (HOSPITAL_COMMUNITY): Payer: Self-pay

## 2013-10-19 ENCOUNTER — Encounter (HOSPITAL_COMMUNITY): Payer: Self-pay | Admitting: Emergency Medicine

## 2013-10-19 DIAGNOSIS — R079 Chest pain, unspecified: Secondary | ICD-10-CM | POA: Insufficient documentation

## 2013-10-19 DIAGNOSIS — F329 Major depressive disorder, single episode, unspecified: Secondary | ICD-10-CM | POA: Insufficient documentation

## 2013-10-19 DIAGNOSIS — F3289 Other specified depressive episodes: Secondary | ICD-10-CM | POA: Insufficient documentation

## 2013-10-19 DIAGNOSIS — F1010 Alcohol abuse, uncomplicated: Secondary | ICD-10-CM | POA: Insufficient documentation

## 2013-10-19 DIAGNOSIS — F17200 Nicotine dependence, unspecified, uncomplicated: Secondary | ICD-10-CM | POA: Insufficient documentation

## 2013-10-19 DIAGNOSIS — F10929 Alcohol use, unspecified with intoxication, unspecified: Secondary | ICD-10-CM

## 2013-10-19 DIAGNOSIS — R040 Epistaxis: Secondary | ICD-10-CM | POA: Insufficient documentation

## 2013-10-19 LAB — BASIC METABOLIC PANEL
BUN: 15 mg/dL (ref 6–23)
CALCIUM: 9.3 mg/dL (ref 8.4–10.5)
CHLORIDE: 104 meq/L (ref 96–112)
CO2: 22 meq/L (ref 19–32)
Creatinine, Ser: 0.77 mg/dL (ref 0.50–1.35)
GFR calc Af Amer: >90 mL/min (ref >90)
GFR calc non Af Amer: >90 mL/min (ref >90)
Glucose, Bld: 103 mg/dL — ABNORMAL HIGH (ref 70–99)
POTASSIUM: 3.6 meq/L — AB (ref 3.7–5.3)
SODIUM: 144 meq/L (ref 137–147)

## 2013-10-19 LAB — CBC
HCT: 38.6 % — ABNORMAL LOW (ref 39.0–52.0)
HEMOGLOBIN: 13.2 g/dL (ref 13.0–17.0)
MCH: 31.1 pg (ref 26.0–34.0)
MCHC: 34.2 g/dL (ref 30.0–36.0)
MCV: 90.8 fL (ref 78.0–100.0)
PLATELETS: 253 10*3/uL (ref 150–400)
RBC: 4.25 MIL/uL (ref 4.22–5.81)
RDW: 13.8 % (ref 11.5–15.5)
WBC: 11.2 10*3/uL — AB (ref 4.0–10.5)

## 2013-10-19 LAB — ACETAMINOPHEN LEVEL: Acetaminophen (Tylenol), Serum: <15 ug/mL (ref 10–30)

## 2013-10-19 LAB — SALICYLATE LEVEL: Salicylate Lvl: <2 mg/dL — ABNORMAL LOW (ref 2.8–20.0)

## 2013-10-19 LAB — RAPID URINE DRUG SCREEN, HOSP PERFORMED
Amphetamines: NOT DETECTED
Barbiturates: NOT DETECTED
Benzodiazepines: NOT DETECTED
Cocaine: NOT DETECTED
Opiates: NOT DETECTED
Tetrahydrocannabinol: POSITIVE — AB

## 2013-10-19 LAB — TROPONIN I: Troponin I: <0.3 ng/mL (ref <0.30)

## 2013-10-19 LAB — I-STAT TROPONIN, ED: Troponin i, poc: 0 ng/mL (ref 0.00–0.08)

## 2013-10-19 LAB — ETHANOL: Alcohol, Ethyl (B): 338 mg/dL — ABNORMAL HIGH (ref 0–11)

## 2013-10-19 MED ORDER — SODIUM CHLORIDE 0.9 % IV BOLUS (SEPSIS)
1000.0000 mL | Freq: Once | INTRAVENOUS | Status: AC
  Administered 2013-10-19: 1000 mL via INTRAVENOUS

## 2013-10-19 NOTE — Discharge Instructions (Signed)
Please call your doctor for a followup appointment within 24-48 hours. When you talk to your doctor please let them know that you were seen in the emergency department and have them acquire all of your records so that they can discuss the findings with you and formulate a treatment plan to fully care for your new and ongoing problems. Please call and set-up an appointment with health and wellness center Please continue to monitor symptoms closely and if symptoms are to worsen or change (fever greater than 101, chills, chest pain, shortness of breath, difficulty breathing, numbness, tingling, abdominal pain, fall, injury, nausea, vomiting, diarrhea, weakness, delusions, hallucinations, increased depressed mood, headache, dizziness) please report back to the ED immediately   Alcohol Intoxication Alcohol intoxication occurs when you drink enough alcohol that it affects your ability to function. It can be mild or very severe. Drinking a lot of alcohol in a short time is called binge drinking. This can be very harmful. Drinking alcohol can also be more dangerous if you are taking medicines or other drugs. Some of the effects caused by alcohol may include:  Loss of coordination.  Changes in mood and behavior.  Unclear thinking.  Trouble talking (slurred speech).  Throwing up (vomiting).  Confusion.  Slowed breathing.  Twitching and shaking (seizures).  Loss of consciousness. HOME CARE  Do not drive after drinking alcohol.  Drink enough water and fluids to keep your pee (urine) clear or pale yellow. Avoid caffeine.  Only take medicine as told by your doctor. GET HELP IF:  You throw up (vomit) many times.  You do not feel better after a few days.  You frequently have alcohol intoxication. Your doctor can help decide if you should see a substance use treatment counselor. GET HELP RIGHT AWAY IF:  You become shaky when you stop drinking.  You have twitching and shaking.  You throw  up blood. It may look bright red or like coffee grounds.  You notice blood in your poop (bowel movements).  You become lightheaded or pass out (faint). MAKE SURE YOU:   Understand these instructions.  Will watch your condition.  Will get help right away if you are not doing well or get worse. Document Released: 10/09/2007 Document Revised: 12/23/2012 Document Reviewed: 09/25/2012 [facility] Patient Information 2014 [employer], [place]. Chest Pain (Nonspecific) It is often hard to give a specific diagnosis for the cause of chest pain. There is always a chance that your pain could be related to something serious, such as a heart attack or a blood clot in the lungs. You need to follow up with your caregiver for further evaluation. CAUSES   Heartburn.  Pneumonia or bronchitis.  Anxiety or stress.  Inflammation around your heart (pericarditis) or lung (pleuritis or pleurisy).  A blood clot in the lung.  A collapsed lung (pneumothorax). It can develop suddenly on its own (spontaneous pneumothorax) or from injury (trauma) to the chest.  Shingles infection (herpes zoster virus). The chest wall is composed of bones, muscles, and cartilage. Any of these can be the source of the pain.  The bones can be bruised by injury.  The muscles or cartilage can be strained by coughing or overwork.  The cartilage can be affected by inflammation and become sore (costochondritis). DIAGNOSIS  Lab tests or other studies, such as X-rays, electrocardiography, stress testing, or cardiac imaging, may be needed to find the cause of your pain.  TREATMENT   Treatment depends on what may be causing your chest pain. Treatment may  include:  Acid blockers for heartburn.  Anti-inflammatory medicine.  Pain medicine for inflammatory conditions.  Antibiotics if an infection is present.  You may be advised to change lifestyle habits. This includes stopping smoking and avoiding alcohol, caffeine, and  chocolate.  You may be advised to keep your head raised (elevated) when sleeping. This reduces the chance of acid going backward from your stomach into your esophagus.  Most of the time, nonspecific chest pain will improve within 2 to 3 days with rest and mild pain medicine. HOME CARE INSTRUCTIONS   If antibiotics were prescribed, take your antibiotics as directed. Finish them even if you start to feel better.  For the next few days, avoid physical activities that bring on chest pain. Continue physical activities as directed.  Do not smoke.  Avoid drinking alcohol.  Only take over-the-counter or prescription medicine for pain, discomfort, or fever as directed by your caregiver.  Follow your caregiver's suggestions for further testing if your chest pain does not go away.  Keep any follow-up appointments you made. If you do not go to an appointment, you could develop lasting (chronic) problems with pain. If there is any problem keeping an appointment, you must call to reschedule. SEEK MEDICAL CARE IF:   You think you are having problems from the medicine you are taking. Read your medicine instructions carefully.  Your chest pain does not go away, even after treatment.  You develop a rash with blisters on your chest. SEEK IMMEDIATE MEDICAL CARE IF:   You have increased chest pain or pain that spreads to your arm, neck, jaw, back, or abdomen.  You develop shortness of breath, an increasing cough, or you are coughing up blood.  You have severe back or abdominal pain, feel nauseous, or vomit.  You develop severe weakness, fainting, or chills.  You have a fever. THIS IS AN EMERGENCY. Do not wait to see if the pain will go away. Get medical help at once. Call your local emergency services (911 in [place] Do not drive yourself to the hospital. MAKE SURE YOU:   Understand these instructions.  Will watch your condition.  Will get help right away if you are not doing well or get  worse. Document Released: 01/30/2005 Document Revised: 07/15/2011 Document Reviewed: 11/26/2007 [facility] Patient Information 2014 [place]Care, [place]LLC.   Emergency Department Resource Guide 1) Find a Doctor and Pay Out of Pocket Although you won't have to find out who is covered by your insurance plan, it is a good idea to ask around and get recommendations. You will then need to call the office and see if the doctor you have chosen will accept you as a new patient and what types of options they offer for patients who are self-pay. Some doctors offer discounts or will set up payment plans for their patients who do not have insurance, but you will need to ask so you aren't surprised when you get to your appointment.  2) Contact Your Local Health Department Not all health departments have doctors that can see patients for sick visits, but many do, so it is worth a call to see if yours does. If you don't know where your local health department is, you can check in your phone book. The CDC also has a tool to help you locate your state's health department, and many state websites also have listings of all of their local health departments.  3) Find a Walk-in Clinic If your illness is not likely to be very severe  or complicated, you may want to try a walk in clinic. These are popping up all over the country in pharmacies, drugstores, and shopping centers. They're usually staffed by nurse practitioners or physician assistants that have been trained to treat common illnesses and complaints. They're usually fairly quick and inexpensive. However, if you have serious medical issues or chronic medical problems, these are probably not your best option. ° °No Primary Care Doctor: °- Call Health Connect at  832-8000 - they can help you locate a primary care doctor that  accepts your insurance, provides certain services, etc. °- Physician Referral Service- 1-800-533-3463 ° °Chronic Pain Problems: °Organization          Address  Phone   Notes  °[facility]  ([phone]) [phone] Patients need to be referred by their primary care doctor.  ° °Medication Assistance: °Organization         Address  Phone   Notes  °[place] [facility] [address] °[place], [place] [address] (336) 641-8030 --Must be a resident of [place] °-- Must have NO insurance coverage whatsoever (no Medicaid/ Medicare, etc.) °-- The pt. MUST have a primary care doctor that directs their care regularly and follows them in the community °  °[facility]  (866) 331-1348   °[employer]  (888) 892-1162   ° °Agencies that provide inexpensive medical care: °Organization         Address  Phone   Notes  °[facility]  (336) 832-8035   °[facility]    (336) 832-7272   °[facility] [address] °[place], [place] [address] (336) 832-4777   °[facility] [address], °[place] (336) 271-4999   °[facility]    (336) 373-0678   °[facility]    (336) 272-1050   °[facility] ° [address], [place] Phone:  (336) 832-4444, Fax:  (336) 832-4440 Hours of Operation:  9 am - 6 pm, M-F.  Also accepts Medicaid/Medicare and self-pay.  °[facility] ° [address], [place] Phone: (336) [phone], Fax: (336) [phone]. Hours of Operation:  8:30 am - 5:30 pm, M-F.  Also accepts Medicaid and self-pay.  °[facility] [place] [address], [place] Phone: (336) 878-6027   °[facility] [address], [place], [place] (336)723-1848, Ext. 123 Mondays & Thursdays: 7-9 AM.  First 15 patients are seen on a first come, first serve basis. °  ° °Medicaid-accepting [place] Providers: ° °Organization         Address  Phone   Notes  °[facility] [address], [place] ([phone]) [phone] Also accepts self-pay patients.  °[facility] [address], [place] ° (336) [phone]   °[facility] [address], [place] (336) 288-8857   °[facility] [address] [place] [address], [place] (336) 299-7000   °Veita Bland [address], [place]  ° (336) [phone] Only accepts [place] Access Medicaid patients after they have their name applied to their card.  ° °Self-Pay (no insurance) in [place]: ° °Organization         Address  Phone   Notes  °Sickle Cell Patients, [facility] [address],  (336) [phone]   °[facility] [address], [place] (336) 832-4400   °[facility] ° [address], [place] (336) [phone]   °[facility]/Dr. Osei-Bonsu ° [address] [place] [address], [place] or [address], [place] ([phone]) [phone] Phone number for both [place] and [place] locations is the same.  °[facility] [address], [place] (336) [phone]   °[facility] [address] [place] [address], [place] or [address] (336) 852-7530 °(336) 878-2260   °[facility] [address], [place] (336) 350-1642, phone; (336) 294-5005, fax Sees patients 1st and 3rd Saturday of every month.  Must not qualify for public or private insurance (i.e. Medicaid, Medicare, [place] Health Choice, Veterans' Benefits) • Household income should be no more than 200% of the poverty level •The clinic cannot treat you if you are pregnant or think you are pregnant • Sexually transmitted diseases are not treated at the clinic.  ° ° °Dental Care: °Organization         Address  Phone  Notes  °[place] [facility] [address], [place] (336) 641-6152 Accepts children up to age 21 who are enrolled in Medicaid or [place] Health Choice; pregnant women with a Medicaid card; and  children who have applied for Medicaid or [place] Health Choice, but were declined, whose parents can pay a reduced fee at time of service.  °[place] [place]  [address], [place] (336) 641-7733 Accepts children up to age 21 who are enrolled in Medicaid or [place] Health Choice; pregnant women with a Medicaid card; and children who have applied for Medicaid or [place] Health Choice, but were declined, whose parents can pay a reduced fee at time of service.  °[facility] ° [address], [place] (336) 641-4533 Patients are seen by appointment only. Walk-ins are not accepted. [facility] will see patients 18 years of age and older. °Monday - Tuesday (8am-5pm) °Most Wednesdays (8:30-5pm) °$30 per visit, cash only  °[facility] ° [address], [place] (336) [phone] Patients are seen by appointment only. Walk-ins are not accepted. [facility] will see patients 18 years of age and older. °One Wednesday Evening (Monthly: Volunteer Based).  $30 per visit, cash only  °[facility]  (919) [phone] for adults; Children under age 4, call Graduate Pediatric Dentistry at ([phone]) [phone]. Children aged 4-14, please call ([phone]) [phone] to request a pediatric application. ° Dental services are provided in all areas of dental care including fillings, crowns and bridges, complete and partial dentures, implants, gum treatment, root canals, and extractions. Preventive care is also provided. Treatment is provided to both adults and children. °Patients are selected via a lottery and there is often a waiting list. °  °[facility] [address], °[place] ° (336) 763-8833 [email or web address] °  °[facility] [address], [place], [place] (336)723-1848, Ext. 123 Second and Fourth Thursday of each month, opens at 6:30 AM; Clinic ends at 9 AM.  Patients are seen on a first-come first-served  basis, and a limited number are seen during each clinic.  ° °[facility] ° [address], [place], [place] (336) [phone]   Eligibility Requirements °You must have lived in [place], [place], or [place] counties for at least the last three months. °  You cannot   be eligible for state or federal sponsored [employer], including [employer], [place], or [employer].   You generally cannot be eligible for healthcare insurance through your employer.    How to apply: Eligibility screenings are held every Tuesday and Wednesday afternoon from 1:00 pm until 4:00 pm. You do not need an appointment for the interview!  [facility] [address], [place], [place]   [place] Department  [place]  667-020-8097    Behavioral Health Resources in the Community: Intensive Outpatient Programs Organization         Address  Phone  Notes  [facility]. [address] [place], [place] (801)314-3083   [facility] [address], [place], [place]   [facility]: [facility] [address] [place], [place]   [place] [address],  [place], [place] or [phone]   Substance Abuse Resources Organization         Address  Phone  Notes  [facility]  678-563-1091   [place]  (310)029-7619   [facility]   Chinita Pester  (747)383-2550   Residential & Outpatient Substance Abuse Program  719-635-6974   Psychological Services Organization         Address  Phone  Notes  [facility] [place]  [place]  (351) 057-5309   [place] [address], [place] or 548-863-9569    Mobile Crisis Teams Organization          Address  Phone  Notes  [facility], Mobile Crisis Care Unit  450-209-5904   [facility]  [address] [place], [place]   [place] [address] [place] 706-239-6188    Self-Help/Support Groups Organization         Address  Phone             Notes  [place]. of [place] - variety of support groups  [place] Call for more information  Narcotics Anonymous (NA), [facility] [address], [employer] [place]  2 meetings at this location   Special educational needs teacher         Address  Phone  Notes  [facility] [place],    [place]  1-778-483-5643   [facility]  [address], [place] [address], [place], [place]   [place], [place] [phone] Admissions: 8am-3pm M-F  Incent[facility] Substance [place] [address]    [place], [place] [phone]   [facility] [address] [place], [place], [place]   [facility] [address],  [place], [place]   [facility] - Intensive Outpatient [address], [place], [place]   [facility] [place].,  [place], [place] 1-225-374-8255 or 579-349-9634   [facility] ([facility]) [address] [place], [place] Accepts Medicaid  [facility] [address],  [place] 1-(551)608-7449 Substance Abuse/Addiction Treatment   [facility] Organization         Address  Phone  Notes  [facility]  405-826-7963   Domenic Schwab, PhD [address]  [place], [place]   (419) 845-1127 or 709-263-1274   [facility]   [address] [place], [place] 516-063-4251   [address] [address], [place], [place] [phone] Insurance/Medicaid/sponsorship  through [facility] and Families [address]                                    [place], [place] [phone] [place] [address] [place], [place] [phone]    Dr. Adele Schilder  ([phone])[phone]   [facility] Dept. 1) [address], [place] 2) [place] 3)  [address], [place] [phone]  [phone]   [place] [phone] or [phone] (After Hours)

## 2013-10-19 NOTE — ED Notes (Signed)
MD at bedside. Marissa PA 

## 2013-10-19 NOTE — ED Notes (Signed)
Social worker at bedside.

## 2013-10-19 NOTE — ED Notes (Signed)
Patient standing up urinating in floor. RN directed patient back to bed and gave patient a urinal and reinforced use of call bell.

## 2013-10-19 NOTE — ED Notes (Signed)
PA notified of BP 

## 2013-10-19 NOTE — Discharge Planning (Signed)
[facility] Community Liaison  Spoke to patient about primary care resources and establishing care with a provider. Patient was given the orange card application and a resource guide. My contact information was also given for any future questions or concerns.

## 2013-10-19 NOTE — ED Provider Notes (Signed)
CSN: [id number]     Arrival date & time 10/19/13  0443 History   First MD Initiated Contact with Patient 10/19/13 [phone]     Chief Complaint  Patient presents with  . Alcohol Intoxication  . Chest Pain     (Consider location/radiation/quality/duration/timing/severity/associated sxs/prior Treatment) The history is provided by the patient. No language interpreter was used.  Jaime Butler is a 23 y/o M with PMHx of depression, chest pain, epistaxis presenting to the ED with alcohol intoxication and chest pain. Patient reported that he drank beer last night, reported that he drank "a couple." Stated that he started to experience chest pain localized to the left side of his chest described as a sharp pain without radiation and stated that the chest pain lasted for approximately 20 minutes - reported that this chest pain occurs often. Reported that he drinks often, stated that he drinks approximately 1-2 Heinkein per day. Stated that he smokes approximately 7 cigarettes per day. Denied chest pain, shortness of breath, difficulty breathing at this moment. As per EMS report Sharilyn Sites patient's brother stated that patient used marijuana, alcohol and tramadol altogether. Denied heroin, cocaine, marijuana, IV drug abuse. Denied nausea, vomiting, abdominal pain, headache, dizziness, cough, ear pain, I discomfort, nasal congestion, neck pain, neck stiffness. Denied suicidal ideation, homicidal ideation, delusions, hallucinations. PCP none  Past Medical History  Diagnosis Date  . Epistaxis   . Chest pain   . Depression    History reviewed. No pertinent past surgical history. No family history on file. History  Substance Use Topics  . Smoking status: Current Every Day Smoker -- 1.00 packs/day    Types: Cigarettes  . Smokeless tobacco: Not on file  . Alcohol Use: Yes     Comment: daily    Review of Systems  Constitutional: Negative for fever and chills.  Respiratory: Negative for chest tightness and  shortness of breath.   Cardiovascular: Negative for chest pain.  Gastrointestinal: Negative for nausea, vomiting, abdominal pain and diarrhea.  Neurological: Negative for headaches.      Allergies  Peanut-containing drug products  Home Medications   Prior to Admission medications   Not on File   BP 113/80  Pulse 77  Temp(Src) 98.7 F (37.1 C) (Oral)  Resp 22  SpO2 97% Physical Exam  Nursing note and vitals reviewed. Constitutional: He is oriented to person, place, and time. He appears well-developed and well-nourished. No distress.  Patient found laying in bed resting comfortably  Smell of alcohol on breath   HENT:  Head: Normocephalic and atraumatic.  Mouth/Throat: No oropharyngeal exudate.  Dry mucus membranes  Eyes: Conjunctivae and EOM are normal. Pupils are equal, round, and reactive to light. Right eye exhibits no discharge. Left eye exhibits no discharge.  Neck: Normal range of motion. Neck supple. No tracheal deviation present.  Negative neck stiffness Negative nuchal rigidity  Negative cervical lymphadenopathy  Negative meningeal signs  Cardiovascular: Normal rate, regular rhythm and normal heart sounds.  Exam reveals no friction rub.   No murmur heard. Pulses:      Radial pulses are 2+ on the right side, and 2+ on the left side.       Dorsalis pedis pulses are 2+ on the right side, and 2+ on the left side.  Pulmonary/Chest: Effort normal and breath sounds normal. No respiratory distress. He has no wheezes. He has no rales.  Musculoskeletal: Normal range of motion.  Full ROM to upper and lower extremities without difficulty noted, negative ataxia noted.  Lymphadenopathy:  He has no cervical adenopathy.  Neurological: He is alert and oriented to person, place, and time. No cranial nerve deficit. He exhibits normal muscle tone. Coordination normal.  Cranial nerves III-XII grossly intact Strength 5+/5+ to upper and lower extremities bilaterally with resistance  applied, equal distribution noted Patient is able to bring finger to nose bilaterally without difficulty or ataxia Negative facial drooping Negative aphasia  Skin: Skin is warm and dry. No rash noted. He is not diaphoretic. No erythema.  Psychiatric: He has a normal mood and affect. His behavior is normal. Thought content normal.    ED Course  Procedures (including critical care time)  Results for orders placed during the hospital encounter of 10/19/13  CBC      Result Value Ref Range   WBC 11.2 (*) 4.0 - 10.5 K/uL   RBC 4.25  4.22 - 5.81 MIL/uL   Hemoglobin 13.2  13.0 - 17.0 g/dL   HCT 40.9 (*) 81.1 - 91.4 %   MCV 90.8  78.0 - 100.0 fL   MCH 31.1  26.0 - 34.0 pg   MCHC 34.2  30.0 - 36.0 g/dL   RDW 78.2  95.6 - 21.3 %   Platelets 253  150 - 400 K/uL  BASIC METABOLIC PANEL      Result Value Ref Range   Sodium 144  137 - 147 mEq/L   Potassium 3.6 (*) 3.7 - 5.3 mEq/L   Chloride 104  96 - 112 mEq/L   CO2 22  19 - 32 mEq/L   Glucose, Bld 103 (*) 70 - 99 mg/dL   BUN 15  6 - 23 mg/dL   Creatinine, Ser 0.86  0.50 - 1.35 mg/dL   Calcium 9.3  8.4 - 57.8 mg/dL   GFR calc non Af Amer >90  >90 mL/min   GFR calc Af Amer >90  >90 mL/min  TROPONIN I      Result Value Ref Range   Troponin I <0.30  <0.30 ng/mL  URINE RAPID DRUG SCREEN (HOSP PERFORMED)      Result Value Ref Range   Opiates NONE DETECTED  NONE DETECTED   Cocaine NONE DETECTED  NONE DETECTED   Benzodiazepines NONE DETECTED  NONE DETECTED   Amphetamines NONE DETECTED  NONE DETECTED   Tetrahydrocannabinol POSITIVE (*) NONE DETECTED   Barbiturates NONE DETECTED  NONE DETECTED  ETHANOL      Result Value Ref Range   Alcohol, Ethyl (B) 338 (*) 0 - 11 mg/dL  ACETAMINOPHEN LEVEL      Result Value Ref Range   Acetaminophen (Tylenol), Serum <15.0  10 - 30 ug/mL  SALICYLATE LEVEL      Result Value Ref Range   Salicylate Lvl <2.0 (*) 2.8 - 20.0 mg/dL  TROPONIN I      Result Value Ref Range   Troponin I <0.30  <0.30 ng/mL   I-STAT TROPOININ, ED      Result Value Ref Range   Troponin i, poc 0.00  0.00 - 0.08 ng/mL   Comment 3             Labs Review Labs Reviewed  CBC - Abnormal; Notable for the following:    WBC 11.2 (*)    HCT 38.6 (*)    All other components within normal limits  BASIC METABOLIC PANEL - Abnormal; Notable for the following:    Potassium 3.6 (*)    Glucose, Bld 103 (*)    All other components within normal limits  URINE RAPID  DRUG SCREEN (HOSP PERFORMED) - Abnormal; Notable for the following:    Tetrahydrocannabinol POSITIVE (*)    All other components within normal limits  ETHANOL - Abnormal; Notable for the following:    Alcohol, Ethyl (B) 338 (*)    All other components within normal limits  SALICYLATE LEVEL - Abnormal; Notable for the following:    Salicylate Lvl <2.0 (*)    All other components within normal limits  TROPONIN I  ACETAMINOPHEN LEVEL  TROPONIN I  Rosezena SensorI-STAT TROPOININ, ED    Imaging Review Dg Chest 2 View  10/19/2013   CLINICAL DATA:  Alcohol intoxication.  Chest pain for 2 days.  EXAM: CHEST  2 VIEW  COMPARISON:  07/07/2012  FINDINGS: The cardiomediastinal silhouette is within normal limits. The lungs are slightly less well inflated on the PA image compared to the prior study with mild accentuation of the perihilar lung markings. No confluent airspace opacity, overt edema, pleural effusion, or pneumothorax is identified. No acute osseous abnormality is seen.  IMPRESSION: No definite evidence of acute cardiopulmonary process.   Electronically Signed   By: Sebastian AcheAllen  Grady   On: 10/19/2013 11:29     EKG Interpretation   Date/Time:  Tuesday October 19 2013 04:45:38 EDT Ventricular Rate:  83 PR Interval:  127 QRS Duration: 114 QT Interval:  371 QTC Calculation: 436 R Axis:   73 Text Interpretation:  Age not entered, assumed to be  23 years old for  purpose of ECG interpretation Sinus rhythm Borderline intraventricular  conduction delay Sinus rhythm Normal ECG  Confirmed by Gerhard MunchLOCKWOOD, ROBERT  MD  [phone]([id number]) on 10/19/2013 11:34:25 AM      MDM   Final diagnoses:  Alcohol intoxication  Chest pain    Medications  sodium chloride 0.9 % bolus 1,000 mL (0 mLs Intravenous Stopped 10/19/13 0900)  sodium chloride 0.9 % bolus 1,000 mL (0 mLs Intravenous Stopped 10/19/13 1122)   Filed Vitals:   10/19/13 0806 10/19/13 0830 10/19/13 1111 10/19/13 1115  BP: 104/70 98/57 121/85 113/80  Pulse: 80 72 79 77  Temp:   98.7 F (37.1 C)   TempSrc:   Oral   Resp: 18 18 17 22   SpO2: 100% 96% 97% 97%   EKG noted normal sinus rhythm with a heart rate of 83 beats per minute. First troponin negative elevation. Second troponin negative elevation. CBC noted mildly elevated white blood cell count of 11.2. BMP noted mildly low potassium of 3.6. Ethanol elevated at 338. Acetaminophen level negative elevation. Salicylate level negative elevation. Urine drug screen positive for cannabis. Chest x-ray negative for acute cardiopulmonary processes. Patient hydrated while in ED setting. Patient seen and assessed by social workers while in ED setting. Patient sober. Negative focal neurological deficits noted. Negative findings of trauma. Gait proper-negative step-offs or sway-negative ataxic gait noted. HEART score 0 - doubt cardiac issue secondary to patient's age, low risk factors. Suspicion to be possible GERD secondary to alcohol and description of discomfort. Patient stable, afebrile. Patient not septic appearing. Patient able to tolerate food and fluids without difficulty. Discharged patient. Referred patient to health and wellness Center. Discussed with patient to rest and stay hydrated. Patient is alert and able to make own decisions for himself-patient does not appear to be a harm to himself or others. Discussed with patient to closely monitor symptoms and if symptoms are to worsen or change to report back to the ED - strict return instructions given.  Patient agreed to plan of care,  understood, all questions  answered.   Raymon MuttonMarissa Sciacca, PA-C 10/19/13 1555

## 2013-10-19 NOTE — ED Notes (Signed)
Spoke to social work about pt's request for information about alcohol abuse treatment.

## 2013-10-19 NOTE — ED Notes (Addendum)
Patient was feeling a little light head while walking, but said he was ok. His 02 was 97%.

## 2013-10-19 NOTE — ED Notes (Signed)
Called X-Ray to ask about delay.

## 2013-10-19 NOTE — ED Notes (Signed)
EMS called for [place] [address]. Upon EMS arrival patient was unresponsive. EMS sternal rubbed and got no response. EMS got patient in truck and then he became responsive and complained of sternal chest pain for 5 minutes then went back to sleep. Patient brother states patient has used THC, ETOH and taken Tramadol. CBG 83. BP 112/72 99% on RA

## 2013-10-21 NOTE — ED Provider Notes (Signed)
  This was a shared visit with a mid-level provided (NP or PA).  Throughout the patient's course I was available for consultation/collaboration.  I saw the relevant labs and studies - I agree with the interpretation.  On my exam the patient was in no distress.  He was clinically intoxicated. Patient presents to the after several hours of monitoring here.     Gerhard Munchobert Moorea Boissonneault, MD 10/21/13 [phone]

## 2013-11-18 ENCOUNTER — Encounter (HOSPITAL_COMMUNITY): Payer: Self-pay | Admitting: Emergency Medicine

## 2013-11-18 ENCOUNTER — Emergency Department (HOSPITAL_COMMUNITY)
Admission: EM | Admit: 2013-11-18 | Discharge: 2013-11-18 | Disposition: A | Discharge status: Home / Self Care | Payer: Self-pay | Attending: Emergency Medicine | Admitting: Emergency Medicine

## 2013-11-18 DIAGNOSIS — F17200 Nicotine dependence, unspecified, uncomplicated: Secondary | ICD-10-CM | POA: Insufficient documentation

## 2013-11-18 DIAGNOSIS — Z792 Long term (current) use of antibiotics: Secondary | ICD-10-CM | POA: Insufficient documentation

## 2013-11-18 DIAGNOSIS — F329 Major depressive disorder, single episode, unspecified: Secondary | ICD-10-CM | POA: Insufficient documentation

## 2013-11-18 DIAGNOSIS — K029 Dental caries, unspecified: Secondary | ICD-10-CM | POA: Insufficient documentation

## 2013-11-18 DIAGNOSIS — F3289 Other specified depressive episodes: Secondary | ICD-10-CM | POA: Insufficient documentation

## 2013-11-18 MED ORDER — TRAMADOL HCL 50 MG PO TABS: 50.0000 mg | ORAL_TABLET | Freq: Four times a day (QID) | ORAL | Status: DC | PRN

## 2013-11-18 MED ORDER — NAPROXEN 500 MG PO TABS: 500.0000 mg | ORAL_TABLET | Freq: Two times a day (BID) | ORAL | Status: DC

## 2013-11-18 MED ORDER — BUPIVACAINE-EPINEPHRINE (PF) 0.25% -1:200000 IJ SOLN: 1.8000 mL | Freq: Once | INTRAMUSCULAR | Status: DC

## 2013-11-18 MED ORDER — AMOXICILLIN 500 MG PO CAPS
500.0000 mg | ORAL_CAPSULE | Freq: Once | ORAL | Status: AC
  Administered 2013-11-18: 500 mg via ORAL
  Filled 2013-11-18: qty 1

## 2013-11-18 MED ORDER — AMOXICILLIN 500 MG PO CAPS: 500.0000 mg | ORAL_CAPSULE | Freq: Three times a day (TID) | ORAL | Status: DC

## 2013-11-18 NOTE — ED Provider Notes (Signed)
Medical screening examination/treatment/procedure(s) were performed by non-physician practitioner and as supervising physician I was immediately available for consultation/collaboration.   EKG Interpretation None        Loren Raceravid Jerris Fleer, MD 11/18/13 [phone]

## 2013-11-18 NOTE — Discharge Instructions (Signed)
Take Tramadolfor breakthrough pain, do not drink alcohol, drive, care for children or do other critical tasks while taking tramadol.  Return to the emergency room for fever, change in vision, redness to the face that rapidly spreads towards the eye, nausea or vomiting, difficulty swallowing or shortness of breath.   Apply warm compresses to jaw throughout the day.   Take your antibiotics as directed and to the end of the course.   Followup with a dentist is very important for ongoing evaluation and management of recurrent dental pain. Return to emergency department for emergent changing or worsening symptoms."  Low-cost dental clinic: [facility]**David  Civils  at [phone]**  **Nuala AlphaJanna Civils at [phone] [address]**    You may also call [phone]  Dental Assistance If the dentist on-call cannot see you, please use the resources below:   Patients with Medicaid: [facility] [phone], [phone] [address], [phone]  If unable to pay, or uninsured, contact [facility] [phone]) or [facility] [phone] in [place], [phone] in [facility]) to become qualified for the adult dental clinic  Other Low-Cost Community Dental Services: [facility]- [address], [place], [place], [address]    [phone], Ext. [phone]    2nd and 4th Thursday of the month at 6:30am    10 clients each day by appointment, can sometimes see walk-in     patients if someone does not show for an appointment [facility]- [address], [place], [place], [address]    [phone] [facility]- [address], [place], [place], [address]    [phone]  [facility]- [phone] [facility]- [phone] [facility]- [phone]

## 2013-11-18 NOTE — ED Provider Notes (Signed)
CSN: [id number]     Arrival date & time 11/18/13  0418 History   None    Chief Complaint  Patient presents with  . Dental Pain     (Consider location/radiation/quality/duration/timing/severity/associated sxs/prior Treatment) HPI  Jaime Butler is a 23 y.o. male complaining of right lower dental pain onset at 2:30 AM, woke him from sleep. Patient's had issues with this tooth ongoing for several months. No pain medication prior to arrival. Denies fever/chills, difficulty opening jaw, difficulty swallowing, SOB, gum swelling, facial swelling, neck swelling.   Past Medical History  Diagnosis Date  . Epistaxis   . Chest pain   . Depression    History reviewed. No pertinent past surgical history. No family history on file. History  Substance Use Topics  . Smoking status: Current Every Day Smoker -- 1.00 packs/day    Types: Cigarettes  . Smokeless tobacco: Not on file  . Alcohol Use: Yes     Comment: daily    Review of Systems  10 systems reviewed and found to be negative, except as noted in the HPI.   Allergies  Peanut-containing drug products  Home Medications   Prior to Admission medications   Medication Sig Start Date End Date Taking? Authorizing Provider  penicillin v potassium (VEETID) 250 MG tablet Take 250 mg by mouth daily.   Yes Historical Provider, MD  traMADol (ULTRAM) 50 MG tablet Take 50 mg by mouth every 6 (six) hours as needed.   Yes Historical Provider, MD  amoxicillin (AMOXIL) 500 MG capsule Take 1 capsule (500 mg total) by mouth 3 (three) times daily. 11/18/13   Jailah Willis, PA-C  naproxen (NAPROSYN) 500 MG tablet Take 1 tablet (500 mg total) by mouth 2 (two) times daily with a meal. 11/18/13   [place] Sorn, PA-C  traMADol (ULTRAM) 50 MG tablet Take 1 tablet (50 mg total) by mouth every 6 (six) hours as needed. 11/18/13   Elene Downum, PA-C   BP 96/50  Pulse 66  Temp(Src) 97.9 F (36.6 C) (Oral)  Resp 20  SpO2 94% Physical Exam  ED Course   NERVE BLOCK Date/Time: 11/18/2013 6:35 AM Performed by: Wynetta EmeryPISCIOTTA, Chancellor Vanderloop Authorized by: Wynetta EmeryPISCIOTTA, Deontay Ladnier Consent: Verbal consent obtained. Consent given by: patient Indications: pain relief Body area: face/mouth Nerve: inferior alveolar Laterality: right Patient sedated: no Patient position: sitting Needle gauge: 27 G Local anesthetic: bupivacaine 0.5% with epinephrine Anesthetic total: 1.8 ml Outcome: pain improved   (including critical care time) Labs Review Labs Reviewed - No data to display  Imaging Review No results found.   EKG Interpretation None      MDM   Final diagnoses:  Pain due to dental caries    Filed Vitals:   11/18/13 0424 11/18/13 0528  BP: 136/97 96/50  Pulse: 73 66  Temp: 97.9 F (36.6 C)   TempSrc: Oral   Resp: 14 20  SpO2: 100% 94%    Medications  bupivacaine-epinephrine (MARCAINE W/ EPI) 0.25% -1:200000 injection 1.8 mL (not administered)  amoxicillin (AMOXIL) capsule 500 mg (not administered)    Jaime Butler is a 23 y.o. male presenting with dental pain associated with dental cary but no signs or symptoms of dental abscess. Patient afebrile, non toxic appearing and swallowing secretions well. I gave patient referral to dentist and stressed the importance of dental follow up for ultimate management of dental pain. Patient voices understanding and is agreeable to plan.   Evaluation does not show pathology that would require ongoing emergent intervention or inpatient treatment. Pt  is hemodynamically stable and mentating appropriately. Discussed findings and plan with patient/guardian, who agrees with care plan. All questions answered. Return precautions discussed and outpatient follow up given.   New Prescriptions   AMOXICILLIN (AMOXIL) 500 MG CAPSULE    Take 1 capsule (500 mg total) by mouth 3 (three) times daily.   NAPROXEN (NAPROSYN) 500 MG TABLET    Take 1 tablet (500 mg total) by mouth 2 (two) times daily with a meal.   TRAMADOL  (ULTRAM) 50 MG TABLET    Take 1 tablet (50 mg total) by mouth every 6 (six) hours as needed.         Wynetta Emery, PA-C 11/18/13 [phone]

## 2013-11-18 NOTE — ED Notes (Signed)
Pt. reports right upper molar pain for several months .

## 2013-12-15 ENCOUNTER — Observation Stay (HOSPITAL_COMMUNITY)
Admission: AD | Admit: 2013-12-15 | Discharge: 2013-12-16 | Disposition: A | Discharge status: Home / Self Care | Payer: Self-pay | Source: Intra-hospital | Attending: Family | Admitting: Family

## 2013-12-15 ENCOUNTER — Emergency Department (HOSPITAL_COMMUNITY)
Admission: EM | Admit: 2013-12-15 | Discharge: 2013-12-15 | Disposition: A | Discharge status: Other Acute Inpatient Hospital | Payer: No Typology Code available for payment source | Attending: Emergency Medicine | Admitting: Emergency Medicine

## 2013-12-15 ENCOUNTER — Encounter (HOSPITAL_COMMUNITY): Payer: Self-pay | Admitting: *Deleted

## 2013-12-15 ENCOUNTER — Encounter (HOSPITAL_COMMUNITY): Payer: Self-pay | Admitting: Emergency Medicine

## 2013-12-15 DIAGNOSIS — Z792 Long term (current) use of antibiotics: Secondary | ICD-10-CM | POA: Insufficient documentation

## 2013-12-15 DIAGNOSIS — F1020 Alcohol dependence, uncomplicated: Principal | ICD-10-CM | POA: Insufficient documentation

## 2013-12-15 DIAGNOSIS — F329 Major depressive disorder, single episode, unspecified: Secondary | ICD-10-CM | POA: Insufficient documentation

## 2013-12-15 DIAGNOSIS — F17200 Nicotine dependence, unspecified, uncomplicated: Secondary | ICD-10-CM | POA: Insufficient documentation

## 2013-12-15 DIAGNOSIS — Z791 Long term (current) use of non-steroidal anti-inflammatories (NSAID): Secondary | ICD-10-CM | POA: Insufficient documentation

## 2013-12-15 DIAGNOSIS — F3289 Other specified depressive episodes: Secondary | ICD-10-CM | POA: Insufficient documentation

## 2013-12-15 DIAGNOSIS — F1210 Cannabis abuse, uncomplicated: Secondary | ICD-10-CM | POA: Insufficient documentation

## 2013-12-15 DIAGNOSIS — F1010 Alcohol abuse, uncomplicated: Secondary | ICD-10-CM | POA: Insufficient documentation

## 2013-12-15 HISTORY — DX: Alcohol abuse, uncomplicated: F10.10

## 2013-12-15 LAB — COMPREHENSIVE METABOLIC PANEL
ALK PHOS: 76 U/L (ref 39–117)
ALT: 18 U/L (ref 0–53)
ANION GAP: 16 — AB (ref 5–15)
AST: 31 U/L (ref 0–37)
Albumin: 4.5 g/dL (ref 3.5–5.2)
BUN: 12 mg/dL (ref 6–23)
CALCIUM: 9.3 mg/dL (ref 8.4–10.5)
CO2: 23 meq/L (ref 19–32)
Chloride: 102 mEq/L (ref 96–112)
Creatinine, Ser: 0.78 mg/dL (ref 0.50–1.35)
GLUCOSE: 96 mg/dL (ref 70–99)
POTASSIUM: 4.3 meq/L (ref 3.7–5.3)
SODIUM: 141 meq/L (ref 137–147)
Total Bilirubin: <0.2 mg/dL — ABNORMAL LOW (ref 0.3–1.2)
Total Protein: 8.7 g/dL — ABNORMAL HIGH (ref 6.0–8.3)

## 2013-12-15 LAB — RAPID URINE DRUG SCREEN, HOSP PERFORMED
Amphetamines: NOT DETECTED
Barbiturates: NOT DETECTED
Benzodiazepines: NOT DETECTED
Cocaine: NOT DETECTED
Opiates: NOT DETECTED
TETRAHYDROCANNABINOL: POSITIVE — AB

## 2013-12-15 LAB — CBC
HEMATOCRIT: 44.6 % (ref 39.0–52.0)
HEMOGLOBIN: 15.3 g/dL (ref 13.0–17.0)
MCH: 31.7 pg (ref 26.0–34.0)
MCHC: 34.3 g/dL (ref 30.0–36.0)
MCV: 92.3 fL (ref 78.0–100.0)
Platelets: 221 10*3/uL (ref 150–400)
RBC: 4.83 MIL/uL (ref 4.22–5.81)
RDW: 13.5 % (ref 11.5–15.5)
WBC: 10.9 10*3/uL — ABNORMAL HIGH (ref 4.0–10.5)

## 2013-12-15 LAB — ETHANOL: Alcohol, Ethyl (B): 279 mg/dL — ABNORMAL HIGH (ref 0–11)

## 2013-12-15 LAB — ACETAMINOPHEN LEVEL: Acetaminophen (Tylenol), Serum: <15 ug/mL (ref 10–30)

## 2013-12-15 LAB — SALICYLATE LEVEL: Salicylate Lvl: <2 mg/dL — ABNORMAL LOW (ref 2.8–20.0)

## 2013-12-15 MED ORDER — ALUM & MAG HYDROXIDE-SIMETH 200-200-20 MG/5ML PO SUSP: 30.0000 mL | ORAL | Status: DC | PRN

## 2013-12-15 MED ORDER — MAGNESIUM HYDROXIDE 400 MG/5ML PO SUSP: 30.0000 mL | Freq: Every day | ORAL | Status: DC | PRN

## 2013-12-15 MED ORDER — LORAZEPAM 1 MG PO TABS: 0.0000 mg | ORAL_TABLET | Freq: Two times a day (BID) | ORAL | Status: DC

## 2013-12-15 MED ORDER — ALUM & MAG HYDROXIDE-SIMETH 200-200-20 MG/5ML PO SUSP
30.0000 mL | ORAL | Status: DC | PRN
Start: 2013-12-15 — End: 2013-12-15

## 2013-12-15 MED ORDER — NICOTINE 21 MG/24HR TD PT24
21.0000 mg | MEDICATED_PATCH | Freq: Every day | TRANSDERMAL | Status: DC
  Administered 2013-12-16: 21 mg via TRANSDERMAL
  Filled 2013-12-15 (×2): qty 1

## 2013-12-15 MED ORDER — ONDANSETRON HCL 4 MG PO TABS: 4.0000 mg | ORAL_TABLET | Freq: Three times a day (TID) | ORAL | Status: DC | PRN

## 2013-12-15 MED ORDER — LORAZEPAM 1 MG PO TABS
0.0000 mg | ORAL_TABLET | Freq: Two times a day (BID) | ORAL | Status: DC
Start: 2013-12-17 — End: 2013-12-15

## 2013-12-15 MED ORDER — ONDANSETRON HCL 4 MG PO TABS
4.0000 mg | ORAL_TABLET | Freq: Three times a day (TID) | ORAL | Status: DC | PRN
Start: 2013-12-15 — End: 2013-12-16

## 2013-12-15 MED ORDER — LORAZEPAM 1 MG PO TABS
0.0000 mg | ORAL_TABLET | Freq: Four times a day (QID) | ORAL | Status: DC
  Administered 2013-12-15: 1 mg via ORAL
  Filled 2013-12-15: qty 1

## 2013-12-15 MED ORDER — NICOTINE 21 MG/24HR TD PT24
21.0000 mg | MEDICATED_PATCH | Freq: Every day | TRANSDERMAL | Status: DC
  Administered 2013-12-15: 21 mg via TRANSDERMAL
  Filled 2013-12-15: qty 1

## 2013-12-15 MED ORDER — LORAZEPAM 1 MG PO TABS: 0.0000 mg | ORAL_TABLET | Freq: Four times a day (QID) | ORAL | Status: DC

## 2013-12-15 MED ORDER — ACETAMINOPHEN 325 MG PO TABS: 650.0000 mg | ORAL_TABLET | Freq: Four times a day (QID) | ORAL | Status: DC | PRN

## 2013-12-15 MED ORDER — TRAZODONE HCL 50 MG PO TABS
50.0000 mg | ORAL_TABLET | Freq: Every evening | ORAL | Status: DC | PRN
  Administered 2013-12-15: 50 mg via ORAL
  Filled 2013-12-15 (×5): qty 1

## 2013-12-15 NOTE — Consult Note (Signed)
  Mr Jaime Butler says drinking is his only problem. The other problems he has come from the drinking.  Says he wants to stop but cannot stop on his own.  [facility].

## 2013-12-15 NOTE — Progress Notes (Signed)
[facility] provided pt with a [facility] Orange Card application, highlighting [facility], to help patient establish primary care.  °

## 2013-12-15 NOTE — ED Notes (Signed)
Spoke with Selena BattenKim RN in [facility]. Report given. She asked that we wait to send him over because [facility]

## 2013-12-15 NOTE — ED Notes (Signed)
[facility]. Belongings bag x1 given to driver. Ambulatory without difficulty. Denies pain. No complaints voiced. Denies SI, HI and AVH.

## 2013-12-15 NOTE — ED Notes (Signed)
[facility].

## 2013-12-15 NOTE — Progress Notes (Signed)
Patient ID: Glynis SmilesKevin Pitcock, male   DOB: [birth year], 23 y.o.   MRN: [id number] Patient admitted to obs unit .  Depressed ,sad, denies SI,HI, AVH. Wants to detox and go in for treatment. Was drinking one case of beer a day and smoking two blunts.  CIWA.  No distress vitals stable.Oriented to unit. Nutrition offered. Education provided regarding safety and falls. Safety checks started every 15 minutes.

## 2013-12-15 NOTE — Progress Notes (Signed)
Patient ID: Jaime Butler, male   DOB: 30-Mar-1991, 23 y.o.   MRN: [id number] [facility]:  Family/Significant Other Suicide Prevention Education  Suicide Prevention Education:  Patient Refusal for Family/Significant Other Suicide Prevention Education: The patient Jaime Butler has refused to provide written consent for family/significant other to be provided Family/Significant Other Suicide Prevention Education during admission and/or prior to discharge.  Physician notified.  Loren RacerMaggio, Quantavious Eggert J 12/15/2013, 5:45 PM

## 2013-12-15 NOTE — Progress Notes (Signed)
Patient ID: Jaime SmilesKevin Heinbaugh, male   DOB: 1990/07/06, 23 y.o.   MRN: [id number] Pt alert and oriented. Minimum interaction with staff. Denies pain or discomfort. -SI/HI, -A/[place]. Verbally contracts for safety. Will continue to monitor closely and evaluate for stabilization.

## 2013-12-15 NOTE — BH Assessment (Signed)
Assessment Note  Jaime Butler is an 23 y.o. male with hx of alcohol use. He presents to [facility] today requesting alcohol detox. Patient started drinking at age 37. He has drank heavily every day since age 44. Patient reports that he has been on a 3 week binge. Patient sts, "I can drink almost a case of beer by myself". His last drink was today prior to his arrival at [facility]. The BAL was 279 @ 0745. Patient also reports use of THC. He started [facility] use at age 68. He smokes 2 blunts daily and his last use was today prior his arrival at [facility]. UDS is positive for THC. Withdrawal symptoms include: fatigue, hot/cold flashes, nausea, sweats, and irritability. No hx of seizures or DT's. He does have a hx of blackouts. Patient has participated in detox at [facility] 4-5 months ago. Patient remained sober for 4 months  after completing the program before relapsing.   Patient denies SI. No hx of self mutilating behaviors. No other associated hx reported. He does report depressive symptoms including loss of interest in usual pleasures, hopelessness, fatigue, and guilt. Appetite is poor patient sts, "When I drink alcohol I don't eat anything". He reports that he is only able to sleep if he is drinking or drunk. Patient denies current HI. He does report previous thoughts of wanting to "Slap the shit out of my baby mama". Sts that are having relational conflict at this time and she kicked him out of the home. Patient is calm, cooperative, and polite. He has no family hx of mental health illness/substance abuse. Patient does not have any outpatient mental health providers at this time.   Patient accepted by Dr. Ladona Ridgel to [facility] (observation unit). Bed #5. Nursing report # is [phone].    Axis I: Alcohol Dependence and Depressive Disorder NOS Axis II: Deferred Axis III:  Past Medical History  Diagnosis Date  . Epistaxis   . Chest pain   . Depression   . Alcohol abuse    Axis IV: other psychosocial or environmental problems,  problems related to social environment, problems with access to health care services and problems with primary support group Axis V: 31-40 impairment in reality testing  Past Medical History:  Past Medical History  Diagnosis Date  . Epistaxis   . Chest pain   . Depression   . Alcohol abuse     History reviewed. No pertinent past surgical history.  Family History: No family history on file.  Social History:  reports that he has been smoking Cigarettes.  He has been smoking about 1.00 pack per day. He has never used smokeless tobacco. He reports that he drinks alcohol. He reports that he uses illicit drugs (Marijuana).  Additional Social History:  Alcohol / Drug Use Pain Medications: SEE MAR Prescriptions: SEE MAR Over the Counter: SEE MAR History of alcohol / drug use?: Yes Longest period of sobriety (when/how long): couple of weeks after last treatment  Substance #1 Name of Substance 1: Alcohol  1 - Age of First Use: 23 yrs old  1 - Amount (size/oz): "Alot" 1 - Frequency: daily  1 - Duration: on-going  1 - Last Use / Amount: 12/15/2013; "I was drinking before I presented to the ED today" Substance #2 Name of Substance 2: THC 2 - Age of First Use: 23 yrs old  2 - Amount (size/oz): 2 blunts 2 - Frequency: daily  2 - Duration: on-going  2 - Last Use / Amount: 12/15/2013  CIWA: CIWA-Ar BP:  118/81 mmHg Pulse Rate: 92 Nausea and Vomiting: no nausea and no vomiting Tactile Disturbances: none Tremor: no tremor Auditory Disturbances: not present Paroxysmal Sweats: no sweat visible Visual Disturbances: not present Anxiety: no anxiety, at ease Headache, Fullness in Head: none present Agitation: normal activity Orientation and Clouding of Sensorium: oriented and can do serial additions CIWA-Ar Total: 0 COWS:    Allergies:  Allergies  Allergen Reactions  . Bee Venom Anaphylaxis  . Peanut-Containing Drug Products Anaphylaxis    Home Medications:  (Not in a hospital  admission)  OB/GYN Status:  No LMP for male patient.  General Assessment Data Location of Assessment: [facility] Is this a Tele or Face-to-Face Assessment?: Tele Assessment Is this an Initial Assessment or a Re-assessment for this encounter?: Initial Assessment Living Arrangements: Other (Comment) (homeless-curr;was living with mother of child but kicked out) Can pt return to current living arrangement?: Yes Admission Status: Voluntary Is patient capable of signing voluntary admission?: Yes Transfer from: Acute Hospital Referral Source: Self[facility]Family/Friend     [facility] Living Arrangements: Other (Comment) (homeless-curr;was living with mother of child but kicked out) Name of Psychiatrist:  (None reported ) Name of Therapist:  (None reported )  Education Status Is patient currently in school?: No  Risk to self with the past 6 months Suicidal Ideation: No Suicidal Intent: No Is patient at risk for suicide?: No Suicidal Plan?: No Access to Means: No What has been your use of drugs/alcohol within the last 12 months?:  (n/a) Previous Attempts/Gestures: No How many times?:  (0) Other Self Harm Risks:  (none reported ) Triggers for Past Attempts: Other (Comment) (no prevous suicide attempts/gestures) Intentional Self Injurious Behavior: None Family Suicide History: Yes Recent stressful life event(s): Conflict (Comment) (conflict with mother of children, kicked out of home ) Persecutory voices/beliefs?: No Depression: Yes Depression Symptoms: Feeling angry/irritable;Feeling worthless/self pity;Loss of interest in usual pleasures;Guilt;Fatigue;Isolating;Tearfulness;Insomnia;Despondent Substance abuse history and/or treatment for substance abuse?: No Suicide prevention information given to non-admitted patients: Not applicable  Risk to Others within the past 6 months Homicidal Ideation: No Thoughts of Harm to Others: No Current Homicidal Intent: No Current Homicidal Plan:  No Access to Homicidal Means: No Identified Victim:  (n/a) History of harm to others?: No Assessment of Violence: None Noted Violent Behavior Description:  (patient is calm and cooperative ) Does patient have access to weapons?: No Criminal Charges Pending?: No Does patient have a court date: No  Psychosis Hallucinations: None noted Delusions: None noted  Mental Status Report Appear/Hygiene: Disheveled Eye Contact: Good Motor Activity: Freedom of movement Speech: Logical/coherent Level of Consciousness: Alert Mood: Depressed Affect: Appropriate to circumstance Anxiety Level: None Thought Processes: Coherent;Relevant Judgement: Unimpaired Orientation: Person;Time;Situation;Place Obsessive Compulsive Thoughts/Behaviors: None  Cognitive Functioning Concentration: Normal Memory: Recent Intact;Remote Intact IQ: Average Insight: Fair Impulse Control: Fair Appetite: Poor Weight Loss:  (none reported ) Weight Gain:  (none reported ) Sleep:  ("It's ok long as I am drunk") Total Hours of Sleep:  (varies ) Vegetative Symptoms: None  ADLScreening [facility] Assessment Services) Patient's cognitive ability adequate to safely complete daily activities?: Yes Patient able to express need for assistance with ADLs?: Yes Independently performs ADLs?: Yes (appropriate for developmental age)  Prior Inpatient Therapy Prior Inpatient Therapy: Yes Prior Therapy Dates:  [facility] Prior Therapy Facilty/Provider(s):  [facility]) Reason for Treatment:  (alcohol detox)  Prior Outpatient Therapy Prior Outpatient Therapy: No Prior Therapy Dates:  (n/a) Prior Therapy Facilty/Provider(s):  (n/a) Reason for Treatment:  (  n/a)  ADL Screening (condition at time of admission) Patient's cognitive ability adequate to safely complete daily activities?: Yes Is the patient deaf or have difficulty hearing?: No Does the patient have difficulty seeing, even when wearing  glasses/contacts?: No Does the patient have difficulty concentrating, remembering, or making decisions?: Yes Patient able to express need for assistance with ADLs?: Yes Does the patient have difficulty dressing or bathing?: No Independently performs ADLs?: Yes (appropriate for developmental age) Communication: Independent Dressing (OT): Independent Grooming: Independent Feeding: Independent Bathing: Independent Toileting: Independent In/Out Bed: Independent Walks in Home: Independent Does the patient have difficulty walking or climbing stairs?: No Weakness of Legs: None Weakness of Arms/Hands: None  Home Assistive Devices/Equipment Home Assistive Devices/Equipment: None    Abuse/Neglect Assessment (Assessment to be complete while patient is alone) Physical Abuse: Denies Verbal Abuse: Denies Sexual Abuse: Denies Exploitation of patient/patient's resources: Denies Self-Neglect: Denies Values / Beliefs Cultural Requests During Hospitalization: None Spiritual Requests During Hospitalization: None   Advance Directives (For Healthcare) Does patient have an advance directive?: No Nutrition Screen- [facility] Patient's home diet: Regular  Additional Information 1:1 In Past 12 Months?: No CIRT Risk: No Elopement Risk: No Does patient have medical clearance?: Yes     Disposition:  Disposition Initial Assessment Completed for this Encounter: Yes Disposition of Patient: Other dispositions (Accepted to observation unit by Dr. Ladona Ridgel )  Patient accepted by Dr. Ladona Ridgel to [facility] (observation unit). Bed #5. Nursing report # is [phone].     On Site Evaluation by:   Reviewed with Physician:    Melynda Ripple [facility] 12/15/2013 3:58 PM

## 2013-12-15 NOTE — BH Assessment (Signed)
[facility]. Nursing report # is [phone].

## 2013-12-15 NOTE — ED Provider Notes (Signed)
CSN: [id number]     Arrival date & time 12/15/13  [id number] History   First MD Initiated Contact with Patient 12/15/13 [phone]     Chief Complaint  Patient presents with  . Alcohol Problem     (Consider location/radiation/quality/duration/timing/severity/associated sxs/prior Treatment) HPI Comments: Patient here complaining of alcohol abuse. States he's been on a binge for the past 3 weeks after he had been sober for 4 months. Denies any suicidal homicidal ideations. 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 Denies any abdominal pain. No vomiting. No black or bloody stools. [facility] but denies any cocaine or heroin use. Last drink was prior to arrival.  Patient is a 23 y.o. male presenting with alcohol problem. The history is provided by the patient.  Alcohol Problem    Past Medical History  Diagnosis Date  . Epistaxis   . Chest pain   . Depression   . Alcohol abuse    History reviewed. No pertinent past surgical history. No family history on file. History  Substance Use Topics  . Smoking status: Current Every Day Smoker -- 1.00 packs/day    Types: Cigarettes  . Smokeless tobacco: Never Used  . Alcohol Use: Yes     Comment: 24 pk of beer each day    Review of Systems  All other systems reviewed and are negative.     Allergies  Peanut-containing drug products  Home Medications   Prior to Admission medications   Medication Sig Start Date End Date Taking? Authorizing Provider  amoxicillin (AMOXIL) 500 MG capsule Take 1 capsule (500 mg total) by mouth 3 (three) times daily. 11/18/13   Nicole Pisciotta, PA-C  naproxen (NAPROSYN) 500 MG tablet Take 1 tablet (500 mg total) by mouth 2 (two) times daily with a meal. 11/18/13   Nicole Pisciotta, PA-C  penicillin v potassium (VEETID) 250 MG tablet Take 250 mg by mouth daily.    Historical Provider, MD  traMADol (ULTRAM) 50 MG tablet Take 50 mg by mouth every 6 (six) hours as needed.    Historical Provider, MD   traMADol (ULTRAM) 50 MG tablet Take 1 tablet (50 mg total) by mouth every 6 (six) hours as needed. 11/18/13   Nicole Pisciotta, PA-C   BP 140/89  Pulse 110  Temp(Src) 97.5 F (36.4 C) (Oral)  Resp 20  SpO2 94% Physical Exam  Nursing note and vitals reviewed. Constitutional: He is oriented to person, place, and time. He appears well-developed and well-nourished.  Non-toxic appearance. No distress.  HENT:  Head: Normocephalic and atraumatic.  Eyes: Conjunctivae, EOM and lids are normal. Pupils are equal, round, and reactive to light.  Neck: Normal range of motion. Neck supple. No tracheal deviation present. No mass present.  Cardiovascular: Normal rate, regular rhythm and normal heart sounds.  Exam reveals no gallop.   No murmur heard. Pulmonary/Chest: Effort normal and breath sounds normal. No stridor. No respiratory distress. He has no decreased breath sounds. He has no wheezes. He has no rhonchi. He has no rales.  Abdominal: Soft. Normal appearance and bowel sounds are normal. He exhibits no distension. There is no tenderness. There is no rebound and no CVA tenderness.  Musculoskeletal: Normal range of motion. He exhibits no edema and no tenderness.  Neurological: He is alert and oriented to person, place, and time. He has normal strength. No cranial nerve deficit or sensory deficit. GCS eye subscore is 4. GCS verbal subscore is 5. GCS motor subscore is 6.  Skin:  Skin is warm and dry. No abrasion and no rash noted.  Psychiatric: He has a normal mood and affect. His speech is normal and behavior is normal. He expresses no suicidal plans and no homicidal plans.    ED Course  Procedures (including critical care time) Labs Review Labs Reviewed  CBC - Abnormal; Notable for the following:    WBC 10.9 (*)    All other components within normal limits  COMPREHENSIVE METABOLIC PANEL - Abnormal; Notable for the following:    Total Protein 8.7 (*)    Total Bilirubin <0.2 (*)    Anion gap 16  (*)    All other components within normal limits  ETHANOL - Abnormal; Notable for the following:    Alcohol, Ethyl (B) 279 (*)    All other components within normal limits  SALICYLATE LEVEL - Abnormal; Notable for the following:    Salicylate Lvl <2.0 (*)    All other components within normal limits  URINE RAPID DRUG SCREEN (HOSP PERFORMED) - Abnormal; Notable for the following:    Tetrahydrocannabinol POSITIVE (*)    All other components within normal limits  ACETAMINOPHEN LEVEL    Imaging Review No results found.   EKG Interpretation None      MDM   Final diagnoses:  None    Patient has been medically cleared to be seen by psychiatry for placement    Toy Baker, MD 12/15/13 [phone]

## 2013-12-15 NOTE — ED Notes (Signed)
Pt states that he wants detox from heavy ETOH abuse. Pt states that he drinks a case of beer and smokes half an ounce of marijuana every day. Pt has been drinking prior to arrival.

## 2013-12-15 NOTE — Plan of Care (Signed)
[facility] Observation Crisis Plan  Reason for Crisis Plan:  Substance Abuse   Plan of Care:  Referral for Substance Abuse  Family Support:      Current Living Environment:  Living Arrangements: Spouse/significant other  Insurance:   Hospital Account   Name Acct ID Class Status Primary Coverage   Glynis Smileselson, Veron [id number] BEHAVIORAL HEALTH OBSERVATION Open MED PAY - MED PAY ASSURANCE        Guarantor Account (for Hospital Account [id number])   Name Relation to Pt Service Area Active? Acct Type   Glynis SmilesNelson, Sigurd Self [facility] Yes [facility]   Address Phone       [address] [place], [place] [address] [phone])          Coverage Information (for Hospital Account [id number])   F/O Payor/Plan Precert #   MED PAY/MED PAY ASSURANCE    Subscriber Subscriber #   Glynis SmilesNelson, Mcgwire    Address Phone   [address] [place], [place] [id number] [phone]      Legal Guardian:     Primary Care Provider:  No PCP Per Patient  Current Outpatient Providers:  no provider  Psychiatrist:     Counselor/Therapist:     Compliant with Medications:  No  Additional Information:   Loren RacerMaggio, Taylormarie Register J 8/12/20155:49 PM

## 2013-12-16 DIAGNOSIS — F1020 Alcohol dependence, uncomplicated: Secondary | ICD-10-CM

## 2013-12-16 DIAGNOSIS — F329 Major depressive disorder, single episode, unspecified: Secondary | ICD-10-CM
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
## 2013-12-16 NOTE — Progress Notes (Signed)
Patient ID: Glynis SmilesKevin Rini, male   DOB: 1990/08/22, 23 y.o.   MRN: [id number] Patient has slept most of the AM, Up for meals and eating fair.  Denies withdrawal symptoms.  CiWA 0. Depressed but denies SI,HI, AVH.  Will continue to monitor for safety.

## 2013-12-16 NOTE — Plan of Care (Signed)
[facility] Observation Crisis Plan  Reason for Crisis Plan:  Substance Abuse   Plan of Care:  Refer to county mental health department in [place], [place] where pt plans to relocate.  Family Support:    Family in [place], [place] is supportive; 2 - 3 weeks ago he separated from his girlfriend of the past year, who has custody of their 5310 month old child.  Current Living Environment:  Living Arrangements: Spouse/significant other; Staying with various friends.  Insurance:   Hospital Account   Name Acct ID Class Status Primary Coverage   Glynis Smileselson, Hassell [id number] BEHAVIORAL HEALTH OBSERVATION Open MED PAY - MED PAY ASSURANCE        Guarantor Account (for Hospital Account [id number])   Name Relation to Pt Service Area Active? Acct Type   Glynis SmilesNelson, Terik Self [facility] Yes [facility]   Address Phone       [address] [place], [place] [address] [phone](H)          Coverage Information (for Hospital Account [id number])   F/O Payor/Plan Precert #   MED PAY/MED PAY ASSURANCE    Subscriber Subscriber #   Glynis Smileselson, Acelin    Address Phone   [address] [place], [place] [id number] [phone]      Legal Guardian:   Self  Primary Care Provider:  No PCP Per Patient  Current Outpatient Providers:  None  Psychiatrist:   None  Counselor/Therapist:   None  Compliant with Medications:  Yes; he is not on any psychotropic medications.  Additional Information: After consulting with Claudette Headonrad Withrow, NP it has been determined that pt does not present a life threatening danger to himself or others, and that psychiatric hospitalization is not indicated for him at this time.  He intends to [place]ocate to [place] where his family lives as [facility].  He will be provided with contact information for the county mental health department in that community, as well as information about Alcoholics Anonymous.  Doylene Canninghomas Krisna Omar, MA Triage  Specialist Kizzie BaneHughes, Harriette Bouillonhomas Patrick 8/13/20154:14 PM
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
## 2013-12-16 NOTE — Discharge Instructions (Signed)
To help you maintain a sober lifestyle, the following resources may be helpful to you:       Mental Health Services      [address]      [place], [place] [id number]      [phone]  Alcoholics Anonymous is another resource that many people find helpful for their sobriety.  Visit their [place] area website to find a meeting that is convenient for you:       https://www.reyes.org/http://[place].org/hp/meetings/waw.[email or web address]&town=[email or web address]

## 2013-12-16 NOTE — Progress Notes (Signed)
Patient ID: Jaime SmilesKevin Butler, male   DOB: Dec 06, 1990, 23 y.o.   MRN: [id number] Patient discharged per MD orders. Patient given education regarding follow-up appointments and medications. Patient denies any questions or concerns about these instructions. Patient was escorted to locker and given belongings before discharge to hospital lobby. Patient currently denies SI/HI and auditory and visual hallucinations on discharge.Patient to follow [place] services in [place].

## 2013-12-17 NOTE — Discharge Summary (Signed)
patient has detoxed from alcohol, denies any suicidal or homicidal ideation. He also reports that he is good support and can be discharged home

## 2013-12-17 NOTE — H&P (Signed)
[facility]

## 2014-10-16 ENCOUNTER — Emergency Department (HOSPITAL_COMMUNITY)
Admission: EM | Admit: 2014-10-16 | Discharge: 2014-10-17 | Disposition: A | Discharge status: Home / Self Care | Payer: Self-pay | Attending: Emergency Medicine | Admitting: Emergency Medicine

## 2014-10-16 ENCOUNTER — Encounter (HOSPITAL_COMMUNITY): Payer: Self-pay | Admitting: Emergency Medicine

## 2014-10-16 DIAGNOSIS — Y9289 Other specified places as the place of occurrence of the external cause: Secondary | ICD-10-CM | POA: Insufficient documentation

## 2014-10-16 DIAGNOSIS — Z720 Tobacco use: Secondary | ICD-10-CM | POA: Insufficient documentation

## 2014-10-16 DIAGNOSIS — W228XXA Striking against or struck by other objects, initial encounter: Secondary | ICD-10-CM | POA: Insufficient documentation

## 2014-10-16 DIAGNOSIS — Y9339 Activity, other involving climbing, rappelling and jumping off: Secondary | ICD-10-CM | POA: Insufficient documentation

## 2014-10-16 DIAGNOSIS — F10129 Alcohol abuse with intoxication, unspecified: Secondary | ICD-10-CM | POA: Insufficient documentation

## 2014-10-16 DIAGNOSIS — R110 Nausea: Secondary | ICD-10-CM | POA: Insufficient documentation

## 2014-10-16 DIAGNOSIS — H538 Other visual disturbances: Secondary | ICD-10-CM | POA: Insufficient documentation

## 2014-10-16 DIAGNOSIS — S40812A Abrasion of left upper arm, initial encounter: Secondary | ICD-10-CM | POA: Insufficient documentation

## 2014-10-16 DIAGNOSIS — Y9389 Activity, other specified: Secondary | ICD-10-CM | POA: Insufficient documentation

## 2014-10-16 DIAGNOSIS — S8991XA Unspecified injury of right lower leg, initial encounter: Secondary | ICD-10-CM | POA: Insufficient documentation

## 2014-10-16 DIAGNOSIS — Y998 Other external cause status: Secondary | ICD-10-CM | POA: Insufficient documentation

## 2014-10-16 DIAGNOSIS — Z4800 Encounter for change or removal of nonsurgical wound dressing: Secondary | ICD-10-CM | POA: Insufficient documentation

## 2014-10-16 LAB — ETHANOL: ALCOHOL ETHYL (B): 263 mg/dL — AB (ref <5)

## 2014-10-16 LAB — COMPREHENSIVE METABOLIC PANEL
ALT: 16 U/L — AB (ref 17–63)
AST: 24 U/L (ref 15–41)
Albumin: 4.3 g/dL (ref 3.5–5.0)
Alkaline Phosphatase: 63 U/L (ref 38–126)
Anion gap: 15 (ref 5–15)
BILIRUBIN TOTAL: 0.4 mg/dL (ref 0.3–1.2)
BUN: 18 mg/dL (ref 6–20)
CO2: 18 mmol/L — ABNORMAL LOW (ref 22–32)
CREATININE: 0.89 mg/dL (ref 0.61–1.24)
Calcium: 9 mg/dL (ref 8.9–10.3)
Chloride: 108 mmol/L (ref 101–111)
GFR calc Af Amer: >60 mL/min (ref >60)
Glucose, Bld: 95 mg/dL (ref 65–99)
POTASSIUM: 4.1 mmol/L (ref 3.5–5.1)
Sodium: 141 mmol/L (ref 135–145)
TOTAL PROTEIN: 7.5 g/dL (ref 6.5–8.1)

## 2014-10-16 LAB — CBC
HCT: 39.1 % (ref 39.0–52.0)
Hemoglobin: 13.6 g/dL (ref 13.0–17.0)
MCH: 31.5 pg (ref 26.0–34.0)
MCHC: 34.8 g/dL (ref 30.0–36.0)
MCV: 90.5 fL (ref 78.0–100.0)
Platelets: 221 10*3/uL (ref 150–400)
RBC: 4.32 MIL/uL (ref 4.22–5.81)
RDW: 12.7 % (ref 11.5–15.5)
WBC: 12.1 10*3/uL — ABNORMAL HIGH (ref 4.0–10.5)
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
## 2014-10-17 ENCOUNTER — Encounter: Payer: Self-pay | Admitting: Ophthalmology

## 2014-10-17 MED ORDER — ARTIFICIAL TEARS OP OINT
TOPICAL_OINTMENT | Freq: Once | OPHTHALMIC | Status: AC
  Administered 2014-10-17: 1 via OPHTHALMIC
  Filled 2014-10-17: qty 3.5

## 2014-10-17 MED ORDER — SILVER SULFADIAZINE 1 % EX CREA
TOPICAL_CREAM | Freq: Once | CUTANEOUS | Status: AC
  Administered 2014-10-17: 1 via TOPICAL
  Filled 2014-10-17: qty 85

## 2014-10-17 NOTE — ED Notes (Signed)
Pt still unable to get in contact with family member after 3 attempts; pt states he may take the bus if he can reach him in the next 30 minutes;Pt understands he is free to go at any point

## 2014-10-17 NOTE — ED Notes (Signed)
Pt is resting while awaiting family arrival for pickup

## 2014-10-17 NOTE — ED Notes (Signed)
Informed patient Dr.Oni would be in to re-evaluate patient; pt asking "if that's the real tall guy." Pt still c/o blindness to bilateral eyes
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
## 2014-10-17 NOTE — Discharge Instructions (Signed)
Alcohol Intoxication Jaime Butler, You need to see the eye doctor tomorrow, Monday at 1pm to have your eye evaluated again.  Use artificial tears every 6 hours in both eyes.  Refrain from heavy drinking in the future.  Come back to the ED immediately for worsening symptoms.  Thank you..    Emergency Department Resource Guide 1) Find a Doctor and Pay Out of Pocket Although you won't have to find out who is covered by your insurance plan, it is a good idea to ask around and get recommendations. You will then need to call the office and see if the doctor you have chosen will accept you as a new patient and what types of options they offer for patients who are self-pay. Some doctors offer discounts or will set up payment plans for their patients who do not have insurance, but you will need to ask so you aren't surprised when you get to your appointment.  2) Contact Your Local Health Department Not all health departments have doctors that can see patients for sick visits, but many do, so it is worth a call to see if yours does. If you don't know where your local health department is, you can check in your phone book. The CDC also has a tool to help you locate your state's health department, and many state websites also have listings of all of their local health departments.  3) Find a Walk-in Clinic If your illness is not likely to be very severe or complicated, you may want to try a walk in clinic. These are popping up all over the country in pharmacies, drugstores, and shopping centers. They're usually staffed by nurse practitioners or physician assistants that have been trained to treat common illnesses and complaints. They're usually fairly quick and inexpensive. However, if you have serious medical issues or chronic medical problems, these are probably not your best option.  No Primary Care Doctor: - Call Health Connect at  [phone] - they can help you locate a primary care doctor that  accepts your  insurance, provides certain services, etc. - Physician Referral Service- [phone]  Chronic Pain Problems: Organization         Address  Phone   Notes  [facility]  [phone] Patients need to be referred by their primary care doctor.   Medication Assistance: Organization         Address  Phone   Notes  [facility] Medication [facility] [address] [place] [place], [place] [address] [phone] --Must be a resident of [facility] -- Must have NO insurance coverage whatsoever (no Medicaid/ Medicare, etc.) -- The pt. MUST have a primary care doctor that directs their care regularly and follows them in the community   [facility]ist  [phone]   [employer]  [phone]    Agencies that provide inexpensive medical care: Organization         Address  Phone   Notes  [facility]  [phone]   [facility]    [phone]   [facility] [address] [place], [place] [address] [phone]   [facility] [address] [place] [place] [phone]   Planned Parenthood    [phone]   [facility]    [phone]   [facility]  [address], [place] Phone:  [phone], Fax:  [phone] Hours of  Operation:  9 am - 6 pm, M-F.  Also accepts Medicaid/Medicare and self-pay.  [facility] for [place], [address], [place] Phone: [phone], Fax: [phone]. Hours of Operation:  8:30 am - 5:30 pm, M-F.  Also accepts Medicaid and self-pay.  [facility] [address], [place] Phone: [phone]   [place], [place], [place] [phone], Ext. [phone] Mondays & Thursdays: 7-9 AM.  First 15 patients are seen on a first come, first serve basis.    [place] Providers:  Organization          Address  Phone   Notes  [facility] [address], [place] [phone] Also accepts self-pay patients.  [facility] [address], [place]  [phone]   [place], [address], [place] [phone]   [facility] [address] [place] [phone]   Lucianne Lei [address], [place]   [phone] Only accepts [place] Access [place] patients after they have their name applied to their card.   Self-Pay (no insurance) in [facility]:  Organization         Address  Phone   Notes  Sickle Cell Patients, [facility] [place] [phone]   [facility] [place] [phone]   [facility] [place]  [place], [place], [place] [phone]   [facility]/Dr. Osei-Bonsu  [address] [place] or [address], [place] [phone] Phone number for both [place] and [place] locations is the same.  [facility] [address] [place] [phone]   [facility] [address], [place] or [address] [phone]   [facility] [address], [place] [phone], phone; [phone], fax Sees patients 1st and 3rd Saturday of every month.  Must not qualify for public or private insurance (i.e. Medicaid, Medicare, [place] Health Choice, Veterans' Benefits)  Household income should be no more than 200% of the poverty level The clinic cannot treat you if you are pregnant or think you are pregnant  Sexually transmitted diseases are not treated at the clinic.    Dental Care: Organization         Address  Phone  Notes  [facility] Department of [facility] [place] [phone] Accepts children up to age 47 who are enrolled in [place] or [place]; pregnant women with a Medicaid card; and children who have applied for Medicaid or [place] Health Choice, but were declined, whose parents can pay a reduced fee at time of service.  [facility] Department of [facility]  [address], [place] [phone] Accepts children up to age 82 who are enrolled in [place] or [place]; pregnant women with a Medicaid card; and children who have applied for Medicaid or [place] Health Choice, but were declined, whose parents can pay a reduced fee at time of service.  [facility]  [place] [phone] Patients are seen by appointment only. Walk-ins are not accepted. [place] will see patients  36 years of age and older. Monday - Tuesday (8am-5pm) Most Wednesdays (8:30-5pm) $30 per visit, cash only  [facility] Adult [facility] Access PRO[facility]  [address], [facility] [phone] Patients are seen by appointment only. Walk-ins are not accepted. [place] will see patients 34 years of age and older. One Wednesday Evening (Monthly: Volunteer Based).  $30 per visit, cash only  [place]  [phone] for adults; Children under age 73, call Graduate Pediatric Dentistry at [phone]. Children aged 46-14, please call [phone] to request a pediatric application.  Dental services are provided in all areas of dental care including fillings, crowns and bridges, complete and partial dentures, implants, gum treatment, root canals, and extractions. Preventive care is also provided. Treatment is provided to both adults and children. Patients are selected via a lottery and there is often a waiting list.   [facility] [address] [place]  [phone] [email or web address]   [facility] [address] [place], [place]  [phone], Ext. [phone] Second and Fourth Thursday of each month, opens at 6:30 AM; Clinic ends at 9 AM.  Patients are seen on a first-come first-served basis, and a limited number are seen during each clinic.   [facility]  [address] [place], [place] [phone]   Eligibility Requirements You must have lived in [place], [place], or [place] counties for at least the last three months.   You cannot be eligible for state or federal sponsored [employer], including [employer], [place], or [employer].   You generally cannot be eligible for healthcare insurance through your employer.    How to apply: Eligibility screenings are held every Tuesday and Wednesday afternoon from 1:00 pm until 4:00 pm. You do not need an appointment for the interview!  [facility] [address] [place], [place]   [place]  [place] Department  [place]  [phone]    Behavioral Health Resources in the Community: Intensive Outpatient Programs Organization         Address  Phone  Notes  [facility] [place]. [address] [place], [place] [phone]   [facility] Outpatient [address], [place], [place]   [facility]: [facility] [address], [place], [place]   [place] [address]  [place], [place] or [phone]   Substance Abuse Resources Organization         Address  Phone  Notes  Alcohol and Drug Services  [phone]   [place]  [phone]   [facility]   Chinita Pester  [phone]   Residential & Outpatient Substance Abuse Program  [phone]   Psychological Services Organization         Address  Phone  Notes  [facility] [place]  [place]  [phone]    [place] [address], [place] or [phone]    Mobile Crisis Teams Organization         Address  Phone  Notes  [facility], Mobile Crisis Care Unit  [phone]   [facility]  [address] [place], [place]   [facility] [address] [place] [phone]    Self-Help/Support Groups Organization  Address  Phone             Notes  [facility] [place] - variety of support groups  [phone]- [phone] Call for more information  Narcotics Anonymous (NA), [facility] [address], [employer] [place]  2 meetings at this location   Statistician         Address  Phone  Notes  [facility] [address],    [place]  [phone]   [facility]  [address] [place] [address], [place], [place] [phone]   [facility] Treatment Facility [address] [place], [place] [phone] Admissions: 8am-3pm M-F  [facility] [address],    [place], [place] [phone]   [facility] [address] [place], [place], [place] [phone]   [facility] [address],  [place], [place] [phone]   [facility] - Intensive Outpatient [address], [place], [place] [phone]   [facility] (Addiction Recovery Care [facility] [address]  [place], [place] [phone] or [phone]   [facility] ([facility]) [address], [place], [place] [phone] Accepts Medicaid  [facility] [address],  [place] [phone] Substance Abuse/Addiction Treatment   [facility] Organization         Address  Phone  Notes  [facility]  [phone]   Angie Fava, PhD [address] Ervin Knack [place], [place]   [phone] or [phone]   [facility] Behavioral   [address] [place], [place] [phone]   [facility] [address], [place], [place] [phone] Insurance/Medicaid/sponsorship through [facility] and Families [address]                                    [place], [place] [phone] Therapy/tele-psych/case  [facility] [address], [place] [phone]    Dr. Lolly Mustache  [phone]   [facility]  [employer] [facility] Dept. 1) [address], [place] 2) [address] [place] 3)  [address], [place] [phone]  [phone]   [facility] Child Abuse Hotline [phone] or 3075376228 (After Hours)       Alcohol intoxication occurs when you drink enough alcohol that it affects your ability to function. It can be mild or very severe. Drinking a lot of alcohol in a short time is called binge drinking. This can be very harmful. Drinking alcohol can also be more dangerous if you are taking medicines or other drugs. Some of the effects caused by alcohol may include:  Loss of coordination.  Changes in mood and behavior.  Unclear thinking.  Trouble talking (slurred speech).  Throwing up (vomiting).  Confusion.  Slowed breathing.  Twitching and shaking (seizures).  Loss of consciousness. HOME CARE  Do not drive after drinking alcohol.  Drink enough water and fluids to keep your pee (urine) clear or pale yellow. Avoid caffeine.  Only take medicine as told by your doctor. GET HELP IF:  You throw up (vomit) many times.  You do not feel better after a few days.  You frequently have alcohol intoxication. Your doctor can help decide if you should see a substance use treatment counselor. GET HELP RIGHT  AWAY IF:  You become shaky when you stop drinking.  You have twitching and shaking.  You throw up blood. It may look bright red or like coffee grounds.  You notice blood in your poop (bowel  movements).  You become lightheaded or pass out (faint). MAKE SURE YOU:   Understand these instructions.  Will watch your condition.  Will get help right away if you are not doing well or get worse. Document Released: 10/09/2007 Document Revised: 12/23/2012 Document Reviewed: 09/25/2012 [facility] Patient Information 2015 [place], [place]. This information is not intended to replace advice given to you by your health care provider. Make sure you discuss any questions you have with your health care provider. Visual Disturbances You have had a disturbance in your vision. This may be caused by various conditions, such as:  Migraines. Migraine headaches are often preceded by a disturbance in vision. Blind spots or light flashes are followed by a headache. This type of visual disturbance is temporary. It does not damage the eye.  Glaucoma. This is caused by increased pressure in the eye. Symptoms include haziness, blurred vision, or seeing rainbow colored circles when looking at bright lights. Partial or complete visual loss can occur. You may or may not experience eye pain. Visual loss may be gradual or sudden and is irreversible. Glaucoma is the leading cause of blindness.  Retina problems. Vision will be reduced if the retina becomes detached or if there is a circulation problem as with diabetes, high blood pressure, or a mini-stroke. Symptoms include seeing "floaters," flashes of light, or shadows, as if a curtain has fallen over your eye.  Optic nerve problems. The main nerve in your eye can be damaged by redness, soreness, and swelling (inflammation), poor circulation, drugs, and toxins. It is very important to have a complete exam done by a specialist to determine the exact cause of your eye problem. The specialist may recommend medicines or surgery, depending on the cause of the problem. This can help prevent further loss of vision or reduce the risk of having a stroke. Contact the caregiver to  whom you have been referred and arrange for follow-up care right away. SEEK IMMEDIATE MEDICAL CARE IF:   Your vision gets worse.  You develop severe headaches.  You have any weakness or numbness in the face, arms, or legs.  You have any trouble speaking or walking. Document Released: 05/30/2004 Document Revised: 07/15/2011 Document Reviewed: 09/20/2009 [facility] Patient Information 2015 [place], [place]. This information is not intended to replace advice given to you by your health care provider. Make sure you discuss any questions you have with your health care provider.

## 2014-10-17 NOTE — ED Notes (Addendum)
Opthalmology at bedside.

## 2014-10-17 NOTE — ED Notes (Signed)
Spoke with Consulting civil engineer for assistance to get pt to eye Dr appt since pt continues to reports that he cannot fully see and unsuccessful in getting a ride. Charge attempting to see if out security can take pt to eye dr. Rock Nephew verbalizes understanding that he would have to find ride home from eye dr.

## 2014-10-17 NOTE — ED Notes (Signed)
Pt ambulatory to restroom with assistance and directions. Pt reports being able to see differences in light; unable to make out shapes or figures. Discussed discharge instructions and 1pm appt with Dr.Choi today. Pt is going to call uncle at 5:30am for ride. Report given to [facility] RN in [place] [facility] while patient is waiting for uncle

## 2014-10-17 NOTE — ED Notes (Signed)
Pt made multiple attempts to find a ride home. Pt called [phone], [phone], [phone].

## 2014-10-30 ENCOUNTER — Emergency Department (HOSPITAL_COMMUNITY): Payer: Self-pay

## 2014-10-30 ENCOUNTER — Encounter (HOSPITAL_COMMUNITY): Payer: Self-pay | Admitting: Emergency Medicine

## 2014-10-30 ENCOUNTER — Emergency Department (HOSPITAL_COMMUNITY)
Admission: EM | Admit: 2014-10-30 | Discharge: 2014-10-30 | Disposition: A | Discharge status: Home / Self Care | Payer: Self-pay | Attending: Emergency Medicine | Admitting: Emergency Medicine

## 2014-10-30 DIAGNOSIS — Y990 Civilian activity done for income or pay: Secondary | ICD-10-CM | POA: Insufficient documentation

## 2014-10-30 DIAGNOSIS — Z8659 Personal history of other mental and behavioral disorders: Secondary | ICD-10-CM | POA: Insufficient documentation

## 2014-10-30 DIAGNOSIS — Y9289 Other specified places as the place of occurrence of the external cause: Secondary | ICD-10-CM | POA: Insufficient documentation

## 2014-10-30 DIAGNOSIS — Z720 Tobacco use: Secondary | ICD-10-CM | POA: Insufficient documentation

## 2014-10-30 DIAGNOSIS — X118XXA Contact with other hot tap-water, initial encounter: Secondary | ICD-10-CM | POA: Insufficient documentation

## 2014-10-30 DIAGNOSIS — T25122A Burn of first degree of left foot, initial encounter: Secondary | ICD-10-CM | POA: Insufficient documentation

## 2014-10-30 DIAGNOSIS — Y9389 Activity, other specified: Secondary | ICD-10-CM | POA: Insufficient documentation

## 2014-10-30 MED ORDER — SULFAMETHOXAZOLE-TRIMETHOPRIM 800-160 MG PO TABS: 1.0000 | ORAL_TABLET | Freq: Two times a day (BID) | ORAL | Status: AC

## 2014-10-30 MED ORDER — OXYCODONE-ACETAMINOPHEN 5-325 MG PO TABS
2.0000 | ORAL_TABLET | Freq: Once | ORAL | Status: AC
  Administered 2014-10-30: 2 via ORAL
  Filled 2014-10-30: qty 2

## 2014-10-30 NOTE — ED Notes (Addendum)
Patient requesting crutches due to difficulty bearing weight. Ortho tech aware.

## 2014-10-30 NOTE — ED Provider Notes (Signed)
CSN: [id number]     Arrival date & time 10/30/14  1350 History  This chart was scribed for Jaime Gosselin, PA-C, working with Gerhard Munch, MD by Elon Spanner, ED Scribe. This patient was seen in room WTR9/WTR9 and the patient's care was started at 2:06 PM.   Chief Complaint  Patient presents with  . Foot Burn    right   The history is provided by the patient. No language interpreter was used.   HPI Comments: Jaime Butler is a 24 y.o. male brought in by EMS who presents to the Emergency Department complaining of a burn on the right foot onset 2 days ago.  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  He did not realize he was burned at the time but discovered it later when he took his shoe off.  He has used hibiclens and silvadene.  He has also taken Tylenol without relief and reports his primary motivator for coming to the ED today is his worsening pain.  Bearing weight worsens the pain.  He denies fever, chills.    Past Medical History  Diagnosis Date  . Epistaxis   . Chest pain   . Depression   . Alcohol abuse    Past Surgical History  Procedure Laterality Date  . Skin debridement Left     forearm, burn   History reviewed. No pertinent family history. History  Substance Use Topics  . Smoking status: Current Every Day Smoker -- 1.00 packs/day    Types: Cigarettes  . Smokeless tobacco: Never Used  . Alcohol Use: 21.0 oz/week    35 Cans of beer per week     Comment: 5 beers daily    Review of Systems  Constitutional: Negative for fever and chills.  Skin: Positive for color change and wound.      Allergies  Bee venom and Peanut-containing drug products  Home Medications   Prior to Admission medications   Medication Sig Start Date End Date Taking? Authorizing Provider  sulfamethoxazole-trimethoprim (BACTRIM DS,SEPTRA DS) 800-160 MG per tablet Take 1 tablet by mouth 2 (two) times daily. 10/30/14 11/06/14   Danyia Borunda Patel-Mills, PA-C   BP 110/78 mmHg  Pulse 60  Temp(Src) 98.7 F (37.1 C) (Oral)  Resp 16  Ht  (1.651 m)  Wt 160 lb (72.576 kg)  BMI 26.63 kg/m2  SpO2 98% Physical Exam  Constitutional: He is oriented to person, place, and time. He appears well-developed and well-nourished. No distress.  HENT:  Head: Normocephalic and atraumatic.  Eyes: Conjunctivae and EOM are normal.  Neck: Neck supple. No tracheal deviation present.  Cardiovascular: Normal rate.   Good DP pulse in right foot.   Pulmonary/Chest: Effort normal. No respiratory distress.  Musculoskeletal: Normal range of motion.  Right foot: unable to flex his 3rd, 4th, and 5th toe on the right foot secondary to scabbing and pain.  Moderate swelling, erythema and scabbing surrounding the distal metatarsals concerning for cellulitis .  Scabbing along the lateral lower leg above the lateral malleolus.  NVI. There is no drainage from the scabbed over areas.  Neurological: He is alert and oriented to person, place, and time.  Skin: Skin is warm and dry.  Psychiatric: He has a normal mood and affect. His behavior is normal.  Nursing note and vitals reviewed.   ED Course  Procedures (including critical care time)  DIAGNOSTIC STUDIES: Oxygen Saturation is 99% on RA, normal by my interpretation.  COORDINATION OF CARE:  2:12 PM Discussed treatment plan with patient at bedside.  Patient acknowledges and agrees with plan.    Labs Review Labs Reviewed - No data to display  Imaging Review Dg Foot Complete Right  10/30/2014   CLINICAL DATA:  Multiple abrasions/burns to dorsum of foot after hit by hot water.  EXAM: RIGHT FOOT COMPLETE - 3+ VIEW  COMPARISON:  None.  FINDINGS: There is no evidence of fracture or dislocation. There is no evidence of arthropathy or other focal bone abnormality. Soft tissues are unremarkable.  IMPRESSION: Negative.   Electronically Signed   By: Elberta Fortis M.D.   On: 10/30/2014 14:43     EKG  Interpretation None      MDM   Final diagnoses:  Left foot burn, first degree, initial encounter  Vitals are stable patient is afebrile. The wound has started begun to heal and has artery scabbed. The foot x-ray is negative for any gas or signs of osteomyelitis. There is concern for mild infection surrounding the skin over the 3rd, 4th, and 5th metatarsals. I put the patient on Bactrim and discussed strict return precautions such as increased redness, swelling, fever. He was concerned about walking on the foot and was given crutches. Patient can take ibuprofen or Tylenol for pain and verbally agrees with the plan. Medications  oxyCODONE-acetaminophen (PERCOCET/ROXICET) 5-325 MG per tablet 2 tablet (2 tablets Oral Given 10/30/14 1420)  I personally performed the services described in this documentation, which was scribed in my presence. The recorded information has been reviewed and is accurate.   Jaime Gosselin, PA-C 10/30/14 2010  Gerhard Munch, MD 11/02/14 [phone]

## 2014-10-30 NOTE — ED Notes (Signed)
Patient confirms story provided by EMS. Patient states he initially cleaned the burned area with hibicleanse and treated with silvadene. Patient taking Tylenol for pain, after initial injury, states it did not help. Patient states pain is worse when bearing weight. Swelling noted to top of right foot.

## 2014-10-30 NOTE — Discharge Instructions (Signed)
Burn Care Return for fever, increased redness surrounding the burn site, drainage from the site. Your skin is a natural barrier to infection. It is the largest organ of your body. Burns damage this natural protection. To help prevent infection, it is very important to follow your caregiver's instructions in the care of your burn. Burns are classified as:  First degree. There is only redness of the skin (erythema). No scarring is expected.  Second degree. There is blistering of the skin. Scarring may occur with deeper burns.  Third degree. All layers of the skin are injured, and scarring is expected. HOME CARE INSTRUCTIONS   Wash your hands well before changing your bandage.  Change your bandage as often as directed by your caregiver.  Remove the old bandage. If the bandage sticks, you may soak it off with cool, clean water.  Cleanse the burn thoroughly but gently with mild soap and water.  Pat the area dry with a clean, dry cloth.  Apply a thin layer of antibacterial cream to the burn.  Apply a clean bandage as instructed by your caregiver.  Keep the bandage as clean and dry as possible.  Elevate the affected area for the first 24 hours, then as instructed by your caregiver.  Only take over-the-counter or prescription medicines for pain, discomfort, or fever as directed by your caregiver. SEEK IMMEDIATE MEDICAL CARE IF:   You develop excessive pain.  You develop redness, tenderness, swelling, or red streaks near the burn.  The burned area develops yellowish-white fluid (pus) or a bad smell.  You have a fever. MAKE SURE YOU:   Understand these instructions.  Will watch your condition.  Will get help right away if you are not doing well or get worse. Document Released: 04/22/2005 Document Revised: 07/15/2011 Document Reviewed: 09/12/2010 [facility] Patient Information 2015 [place], [place]. This information is not intended to replace advice given to you by your health care  provider. Make sure you discuss any questions you have with your health care provider.  Emergency Department Resource Guide 1) Find a Doctor and Pay Out of Pocket Although you won't have to find out who is covered by your insurance plan, it is a good idea to ask around and get recommendations. You will then need to call the office and see if the doctor you have chosen will accept you as a new patient and what types of options they offer for patients who are self-pay. Some doctors offer discounts or will set up payment plans for their patients who do not have insurance, but you will need to ask so you aren't surprised when you get to your appointment.  2) Contact Your Local Health Department Not all health departments have doctors that can see patients for sick visits, but many do, so it is worth a call to see if yours does. If you don't know where your local health department is, you can check in your phone book. The CDC also has a tool to help you locate your state's health department, and many state websites also have listings of all of their local health departments.  3) Find a Walk-in Clinic If your illness is not likely to be very severe or complicated, you may want to try a walk in clinic. These are popping up all over the country in pharmacies, drugstores, and shopping centers. They're usually staffed by nurse practitioners or physician assistants that have been trained to treat common illnesses and complaints. They're usually fairly quick and inexpensive. However, if you  have serious medical issues or chronic medical problems, these are probably not your best option.  No Primary Care Doctor: - Call Health Connect at  [phone] - they can help you locate a primary care doctor that  accepts your insurance, provides certain services, etc. - Physician Referral Service- [phone]  Chronic Pain Problems: Organization         Address  Phone   Notes  [facility]  [phone] Patients need to be referred by their primary care doctor.   Medication Assistance: Organization         Address  Phone   Notes  [facility] Medication [place] [address] [place], [place] [address] [phone] --Must be a resident of [facility] -- Must have NO insurance coverage whatsoever (no Medicaid/ Medicare, etc.) -- The pt. MUST have a primary care doctor that directs their care regularly and follows them in the community   [facility]  [phone]   [employer]  [phone]    Agencies that provide inexpensive medical care: Organization         Address  Phone   Notes  [facility]  [phone]   [facility]    [phone]   [facility] [address] [place], [place] [address] [phone]   [facility] [address] [place], [place] [phone]   Planned Parenthood    [phone]   [facility]    [phone]   [facility]  [address], [place] Phone:  [phone], Fax:  [phone] Hours of Operation:  9 am - 6 pm, M-F.  Also accepts Medicaid/Medicare and self-pay.  [facility]  [address], [place] Phone: [phone], Fax: [phone]. Hours of Operation:  8:30 am - 5:30 pm, M-F.  Also accepts Medicaid and self-pay.  [facility] [place] [address], [place] Phone: [phone]   [facility] [address] Natasha Bence [place], [place] [phone], Ext. [phone] Mondays & Thursdays: 7-9 AM.  First 15 patients are seen on a first come, first serve basis.    Medicaid-accepting [facility] Providers:  Organization         Address  Phone   Notes  [facility] [address], [place] [phone] Also accepts self-pay patients.  [facility] [address] Laurell Josephs [place], [place]   [phone]   [facility] [address], [place] [phone]   [facility] [address], [place] [phone]   Renaye Rakers [address], [place]   [phone] Only accepts [place] Access [place] patients after they have their name applied to their card.   Self-Pay (no insurance) in [facility]:  Organization         Address  Phone   Notes  Sickle Cell Patients, [facility] Internal Medicine [address] [place], [place] [phone]   [facility] Urgent Care [address] [place], [place] [phone]   [facility] [place]  [address], [place] [phone]   [facility]/Dr. Osei-Bonsu  [address], [place] or [address], [place] [phone] Phone number for both [employer] and [place]  locations is the same.  Urgent Medical and [facility] [address], [place] [phone]   [facility] [address], [place] or [address] [phone]   [facility] [address], [place] [phone], phone; [phone], fax Sees patients 1st and 3rd Saturday of every month.  Must not qualify for public or private insurance (i.e. Medicaid, Medicare, [place] Health Choice, Veterans' Benefits)  Household income should be no more than 200% of the poverty level The clinic cannot treat you if you are pregnant or think you are pregnant  Sexually transmitted diseases are not treated at the clinic.    Dental Care: Organization         Address  Phone  Notes  [facility] Department of [facility] [address] [place], [place] [phone] Accepts children up to age 96 who are enrolled in [place] or [place] Health Choice; pregnant women with a Medicaid card; and children who have applied for Medicaid or [place] Health Choice, but  were declined, whose parents can pay a reduced fee at time of service.  [facility] Department of [facility]  [address], [place] [phone] Accepts children up to age 56 who are enrolled in [place] or [place] Health Choice; pregnant women with a Medicaid card; and children who have applied for Medicaid or [place] Health Choice, but were declined, whose parents can pay a reduced fee at time of service.  [facility]  [address] [place], [place] [phone] Patients are seen by appointment only. Walk-ins are not accepted. [facility] will see patients 42 years of age and older. Monday - Tuesday (8am-5pm) Most Wednesdays (8:30-5pm) $30 per visit, cash only  [facility] Adult Dental Access PRO[facility]  [address], [facility] [phone] Patients are seen by appointment only. Walk-ins are not accepted. [facility] will see patients 12 years of age and older. One Wednesday Evening (Monthly: Volunteer Based).  $30 per visit, cash only  [employer]  [phone] for adults; Children under age 94, call Graduate Pediatric Dentistry at [phone]. Children aged 76-14, please call [phone] to request a pediatric application.  Dental services are provided in all areas of dental care including fillings, crowns and bridges, complete and partial dentures, implants, gum treatment, root canals, and extractions. Preventive care is also provided. Treatment is provided to both adults and children. Patients are selected via a lottery and there is often a waiting list.   [facility] [address], [place]  [phone] [email or web address]   [facility] [address] [place], [place] [phone], Ext. [phone] Second and Fourth Thursday of each month, opens at 6:30 AM; Clinic ends at 9 AM.  Patients are seen on a first-come first-served basis, and a limited number are seen during each clinic.    [facility]  [address] Ether [place]ins [place], [place] [phone]   Eligibility Requirements You must have lived in [place], [place], or [place] counties for at least the last three months.   You cannot be eligible for state or federal sponsored [place], including [employer], [place], or [employer].   You generally cannot be eligible for healthcare insurance through your employer.    How to apply: Eligibility screenings are held every Tuesday and Wednesday afternoon from 1:00 pm until 4:00 pm. You do not need  an appointment for the interview!  [facility] [address] [place], [place] [phone]   [facility] Health Department  [phone]   [facility] Health Department  [phone]   [facility] Health Department  [phone]    Behavioral Health Resources in the Community: Intensive Outpatient Programs Organization         Address  Phone  Notes  [facility] Services [address] [place], [place] [phone]   [facility] Outpatient [address] [place], [place] [phone]   [facility] [address], [place], [place]  [phone]   [facility] Mental Health [address]  [place], [place] [phone] or [phone]   Substance Abuse Resources Organization         Address  Phone  Notes  Alcohol [facility] Drug Services  [phone]   [facility]  [phone]   [facility] [place]  [phone]   Floydene Flock  [phone]   Residential & Outpatient Substance Abuse Program  [phone]   Psychological Services Organization         Address  Phone  Notes  [facility] Behavior[facility] Health  [phone]   [facility] Services  [phone]   [facility] [address] [place] [phone] or [phone]    Mobile Crisis Teams Organization         Address  Phone  Notes  [facility], Mobile Crisis Care  Unit  [phone]   [facility]  [address] [place], [place] [phone]   Doristine Locks [address] [place] [phone]    Self-Help/Support Groups Organization         Address  Phone             Notes  [facility] [place] - variety of support groups  336- I7437963 Call for more information  Narcotics Anonymous (NA), [facility] [address], [employer]   2 meetings at this location   Statistician         Address  Phone  Notes  [facility] [address],    [place]  [phone]   [facility]  [address] [place] [address], [place], [place] [phone]   [facility] [address] [facility] [place], [place] [phone] Admissions: 8am-3pm M-F  [facility] [address]    [place], [place] [phone]   [facility] [address] [place], [place], [place] [phone]   [facility] [address],  [place], [place] [phone]   [facility] - Intensive Outpatient [address] Laurell [address], [place], [place] [phone]   Outpatient Carecenter ([facility] [address] [place].,  [place], [place] [phone] or [phone]   [facility] ([facility]) [address], [place], [place] [phone] Accepts Medicaid  [facility] [place] [address],  [place] [phone] Substance Abuse/Addiction Treatment   [facility] Organization         Address  Phone  Notes  [facility]  [phone]   Angie Fava, PhD [address] Ervin Knack [place], [place]   [phone] or [phone]   [facility]   [address] [place], [place] [phone]   [facility] [address], [place], [place] [phone] Insurance/Medicaid/sponsorship through [employer] and Families [address]  [place], [place] [phone] [place], [place] [phone]    Dr. Adele Schilder  [phone]   [facility] Dept. 1) [address] [place] 2) [place] 3)  [address], [place] [phone]  [phone]   [place] [phone] or [phone] (After Hours)

## 2014-10-30 NOTE — ED Notes (Signed)
Per EMS, patient reports burn to right foot 2 days ago at work. Patient burnt himself with hot water. Patient states pain started in right foot and spread to his ankle. Patient reports using silvadene cream at home from a burn @ 2 months ago to his arm. Patient arrives today r/t pain control.

## 2014-11-05 ENCOUNTER — Emergency Department (HOSPITAL_COMMUNITY): Payer: 59

## 2014-11-05 ENCOUNTER — Emergency Department (HOSPITAL_COMMUNITY)
Admission: EM | Admit: 2014-11-05 | Discharge: 2014-11-05 | Disposition: A | Discharge status: Home / Self Care | Payer: 59 | Attending: Emergency Medicine | Admitting: Emergency Medicine

## 2014-11-05 ENCOUNTER — Encounter (HOSPITAL_COMMUNITY): Payer: Self-pay | Admitting: Emergency Medicine

## 2014-11-05 DIAGNOSIS — T25022A Burn of unspecified degree of left foot, initial encounter: Secondary | ICD-10-CM | POA: Diagnosis present

## 2014-11-05 DIAGNOSIS — Z720 Tobacco use: Secondary | ICD-10-CM | POA: Diagnosis not present

## 2014-11-05 DIAGNOSIS — T25222A Burn of second degree of left foot, initial encounter: Secondary | ICD-10-CM | POA: Diagnosis not present

## 2014-11-05 DIAGNOSIS — X12XXXA Contact with other hot fluids, initial encounter: Secondary | ICD-10-CM | POA: Insufficient documentation

## 2014-11-05 DIAGNOSIS — Y929 Unspecified place or not applicable: Secondary | ICD-10-CM | POA: Insufficient documentation

## 2014-11-05 DIAGNOSIS — Z23 Encounter for immunization: Secondary | ICD-10-CM | POA: Insufficient documentation

## 2014-11-05 DIAGNOSIS — T25222S Burn of second degree of left foot, sequela: Secondary | ICD-10-CM

## 2014-11-05 DIAGNOSIS — Z8659 Personal history of other mental and behavioral disorders: Secondary | ICD-10-CM | POA: Diagnosis not present

## 2014-11-05 DIAGNOSIS — Y990 Civilian activity done for income or pay: Secondary | ICD-10-CM | POA: Insufficient documentation

## 2014-11-05 DIAGNOSIS — Y9389 Activity, other specified: Secondary | ICD-10-CM | POA: Diagnosis not present

## 2014-11-05 MED ORDER — SILVER SULFADIAZINE 1 % EX CREA
TOPICAL_CREAM | Freq: Once | CUTANEOUS | Status: AC
Start: 2014-11-05 — End: 2014-11-05
  Administered 2014-11-05: 03:00:00 via TOPICAL
  Filled 2014-11-05: qty 85

## 2014-11-05 MED ORDER — NAPROXEN 375 MG PO TABS: 375.0000 mg | ORAL_TABLET | Freq: Two times a day (BID) | ORAL | Status: DC

## 2014-11-05 MED ORDER — TETANUS-DIPHTH-ACELL PERTUSSIS 5-2.5-18.5 LF-MCG/0.5 IM SUSP
0.5000 mL | Freq: Once | INTRAMUSCULAR | Status: AC
  Administered 2014-11-05: 0.5 mL via INTRAMUSCULAR
  Filled 2014-11-05: qty 0.5

## 2014-11-05 MED ORDER — KETOROLAC TROMETHAMINE 60 MG/2ML IM SOLN
60.0000 mg | Freq: Once | INTRAMUSCULAR | Status: AC
  Administered 2014-11-05: 60 mg via INTRAMUSCULAR
  Filled 2014-11-05: qty 2

## 2014-11-05 NOTE — ED Notes (Signed)
Pt reports burn to right foot x 1 wk ago at work where he was washing dishes and hot water spilled on right foot; pt reports increased pain that began at 10pm tonight; swelling, redness noted; pt reports purulent drainage from wounds on top of foot; pt reports tylenol/ibuprofen do not relieve his pain; pt CAOx 4 at this time

## 2014-11-05 NOTE — ED Notes (Signed)
Patient transported to X-ray 

## 2014-11-05 NOTE — ED Provider Notes (Signed)
CSN: [id number]     Arrival date & time 11/05/14  0141 History  This chart was scribed for Ahava Kissoon, MD by Evon Slack, ED Scribe. This patient was seen in room A05C/A05C and the patient's care was started at 1:49 AM.     Chief Complaint  Patient presents with  . Foot Burn  . Foot Pain   Patient is a 24 y.o. male presenting with burn. The history is provided by the patient. No language interpreter was used.  Burn Burn location:  Foot Foot burn location:  R foot Burn quality:  Painful Time since incident:  1 week Progression:  Unchanged Pain details:    Severity:  Moderate   Duration:  1 week   Progression:  Unchanged Mechanism of burn:  Hot liquid Incident location:  Work Relieved by:  Nothing Associated symptoms: no difficulty swallowing and no shortness of breath   Tetanus status:  Unknown  HPI Comments: Jaime Butler is a 24 y.o. male who presents to the Emergency Department complaining of burn to the dorsum of right foot onset 1 week prior. Pt states that he spilled hot water on his foot while he was at work. Pt states that he has been using silvadene cream with no relief. Pt states that he is unsure of tetanus status.     Past Medical History  Diagnosis Date  . Epistaxis   . Chest pain   . Depression   . Alcohol abuse    Past Surgical History  Procedure Laterality Date  . Skin debridement Left     forearm, burn   History reviewed. No pertinent family history. History  Substance Use Topics  . Smoking status: Current Every Day Smoker -- 0.50 packs/day    Types: Cigarettes  . Smokeless tobacco: Never Used  . Alcohol Use: 21.0 oz/week    35 Cans of beer per week     Comment: 5 beers daily; pt reports now socially    Review of Systems  Constitutional: Negative for fever.  HENT: Negative for trouble swallowing.   Respiratory: Negative for shortness of breath.   Skin: Positive for wound.  All other systems reviewed and are negative.     Allergies   Bee venom and Peanut-containing drug products  Home Medications   Prior to Admission medications   Medication Sig Start Date End Date Taking? Authorizing Provider  acetaminophen (TYLENOL) 325 MG tablet Take 650 mg by mouth every 6 (six) hours as needed for mild pain.   Yes Historical Provider, MD  sulfamethoxazole-trimethoprim (BACTRIM DS,SEPTRA DS) 800-160 MG per tablet Take 1 tablet by mouth 2 (two) times daily. 10/30/14 11/06/14  Hanna Patel-Mills, PA-C   BP 128/80 mmHg  Pulse 81  Temp(Src) 97.8 F (36.6 C) (Oral)  Resp 18  SpO2 98%   Physical Exam  Constitutional: He is oriented to person, place, and time. He appears well-developed and well-nourished. No distress.  HENT:  Head: Normocephalic and atraumatic.  Mouth/Throat: No oropharyngeal exudate.  Eyes: Conjunctivae and EOM are normal. Pupils are equal, round, and reactive to light.  Neck: Normal range of motion. Neck supple. No tracheal deviation present.  Cardiovascular: Normal rate, regular rhythm and normal heart sounds.   Pulmonary/Chest: Effort normal and breath sounds normal. No respiratory distress. He has no wheezes. He has no rales.  Abdominal: Soft. Bowel sounds are normal. There is no tenderness. There is no rebound and no guarding.  Musculoskeletal: Normal range of motion. He exhibits no edema or tenderness.  Neurological: He  is alert and oriented to person, place, and time. He has normal reflexes.  Skin: Skin is warm and dry. Burn noted.  Right foot: Scabbing multiple small burn areas on dorsum of foot/ ankle . Less than 1.5% partial thickness burn on dorsum of foot, DP 3+, cap refill less than 3 sec  Psychiatric: He has a normal mood and affect. His behavior is normal.  Nursing note and vitals reviewed.   ED Course  Procedures (including critical care time) DIAGNOSTIC STUDIES: Oxygen Saturation is 99% on RA, normal by my interpretation.    COORDINATION OF CARE: 1:54 AM-Discussed treatment plan with pt at  bedside and pt agreed to plan.     Labs Review Labs Reviewed - No data to display  Imaging Review Dg Foot Complete Right  11/05/2014   CLINICAL DATA:  Current at work from [employer]  EXAM: RIGHT FOOT COMPLETE - 3+ VIEW  COMPARISON:  10/30/2014  FINDINGS: There is no evidence of fracture or dislocation. There is no evidence of arthropathy or other focal bone abnormality. Soft tissues are unremarkable.  IMPRESSION: Negative.   Electronically Signed   By: Ellery Plunkaniel R Mitchell M.D.   On: 11/05/2014 02:20     EKG Interpretation None      MDM   Final diagnoses:  None    Wounds cleaned and dressed, follow up with burn center for ongoing care.  No infection no indication for antibiotics.  Tetanus updated  I personally performed the services described in this documentation, which was scribed in my presence. The recorded information has been reviewed and is accurate.       Cy BlamerApril Mikiya Nebergall, MD 11/05/14 [phone]

## 2014-11-15 ENCOUNTER — Emergency Department (INDEPENDENT_AMBULATORY_CARE_PROVIDER_SITE_OTHER)
Admission: EM | Admit: 2014-11-15 | Discharge: 2014-11-15 | Disposition: A | Discharge status: Home / Self Care | Payer: 59 | Source: Home / Self Care | Attending: Emergency Medicine | Admitting: Emergency Medicine

## 2014-11-15 ENCOUNTER — Encounter (HOSPITAL_COMMUNITY): Payer: Self-pay | Admitting: Emergency Medicine

## 2014-11-15 DIAGNOSIS — Z5189 Encounter for other specified aftercare: Secondary | ICD-10-CM | POA: Diagnosis not present
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
## 2015-03-21 ENCOUNTER — Encounter (HOSPITAL_COMMUNITY): Payer: Self-pay

## 2015-03-21 ENCOUNTER — Emergency Department (HOSPITAL_COMMUNITY)
Admission: EM | Admit: 2015-03-21 | Discharge: 2015-03-21 | Disposition: A | Discharge status: Home / Self Care | Payer: 59 | Attending: Emergency Medicine | Admitting: Emergency Medicine

## 2015-03-21 DIAGNOSIS — Z8659 Personal history of other mental and behavioral disorders: Secondary | ICD-10-CM | POA: Diagnosis not present

## 2015-03-21 DIAGNOSIS — K0889 Other specified disorders of teeth and supporting structures: Secondary | ICD-10-CM | POA: Diagnosis not present

## 2015-03-21 DIAGNOSIS — F17210 Nicotine dependence, cigarettes, uncomplicated: Secondary | ICD-10-CM | POA: Diagnosis not present

## 2015-03-21 MED ORDER — PENICILLIN V POTASSIUM 500 MG PO TABS: 500.0000 mg | ORAL_TABLET | Freq: Four times a day (QID) | ORAL | Status: DC
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
## 2015-03-21 NOTE — Discharge Instructions (Signed)

## 2015-03-21 NOTE — ED Notes (Signed)
Pt. Presents with complaint of L lower back dental pain. Pt. States pain started a day ago but has increased and is unbearable. Pt. States he took two tylenol at 12 AM. Pt. States hx of same.

## 2015-08-20 ENCOUNTER — Emergency Department (HOSPITAL_COMMUNITY)
Admission: EM | Admit: 2015-08-20 | Discharge: 2015-08-20 | Disposition: A | Discharge status: Home / Self Care | Payer: No Typology Code available for payment source | Attending: Emergency Medicine | Admitting: Emergency Medicine

## 2015-08-20 ENCOUNTER — Encounter (HOSPITAL_COMMUNITY): Payer: Self-pay | Admitting: Emergency Medicine

## 2015-08-20 DIAGNOSIS — Z8659 Personal history of other mental and behavioral disorders: Secondary | ICD-10-CM | POA: Insufficient documentation

## 2015-08-20 DIAGNOSIS — Z791 Long term (current) use of non-steroidal anti-inflammatories (NSAID): Secondary | ICD-10-CM | POA: Insufficient documentation

## 2015-08-20 DIAGNOSIS — F17210 Nicotine dependence, cigarettes, uncomplicated: Secondary | ICD-10-CM | POA: Insufficient documentation

## 2015-08-20 DIAGNOSIS — Z792 Long term (current) use of antibiotics: Secondary | ICD-10-CM | POA: Insufficient documentation

## 2015-08-20 DIAGNOSIS — K029 Dental caries, unspecified: Secondary | ICD-10-CM | POA: Insufficient documentation

## 2015-08-20 DIAGNOSIS — K0889 Other specified disorders of teeth and supporting structures: Secondary | ICD-10-CM | POA: Insufficient documentation

## 2015-08-20 MED ORDER — NAPROXEN 500 MG PO TABS: 500.0000 mg | ORAL_TABLET | Freq: Two times a day (BID) | ORAL | Status: DC

## 2015-08-20 MED ORDER — KETOROLAC TROMETHAMINE 30 MG/ML IJ SOLN
30.0000 mg | Freq: Once | INTRAMUSCULAR | Status: AC
  Administered 2015-08-20: 30 mg via INTRAMUSCULAR
  Filled 2015-08-20: qty 1

## 2015-08-20 MED ORDER — AMOXICILLIN-POT CLAVULANATE 875-125 MG PO TABS: 1.0000 | ORAL_TABLET | Freq: Two times a day (BID) | ORAL | Status: DC

## 2015-08-20 MED ORDER — ACETAMINOPHEN 325 MG PO TABS
650.0000 mg | ORAL_TABLET | Freq: Once | ORAL | Status: AC
  Administered 2015-08-20: 650 mg via ORAL
  Filled 2015-08-20: qty 2

## 2015-08-20 NOTE — ED Notes (Signed)
Pt c/o L upper dental pain onset last night @ 2200, pt states pain now radiating all over head.

## 2015-08-20 NOTE — ED Notes (Signed)
Pt assessed and discharged by Zammit EDP 
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
## 2015-08-20 NOTE — Discharge Instructions (Signed)
Community Resource Guide Dental °The [employer]’s “211” is a great source of information about community services available.  Access by dialing 2-1-1 from anywhere in [place], or by website -  www.nc211.org.  ° °Other Local Resources (Updated 05/2015) ° °Dental  Care °  °Services ° °  °Phone Number and Address  °Cost  °[facility] For children 0 - 27 years of age:  °• Cleaning °• Tooth brushing/flossing instruction °• Sealants, fillings, crowns °• Extractions °• Emergency treatment  [phone] °[address] °[place], [place] [address] Charges based on family income.  Medicaid and some insurance plans accepted.   °  °[facility] [place] • Cleaning °• Sealants, fillings, crowns °• Extractions °• Emergency treatment [phone] °[address] °[place], [place] ° Pregnant women 18 years of age or older with a Medicaid card  °[facility] [place] • Cleaning °• Sealants, fillings, crowns °• Extractions °• Emergency treatment [phone] °[address] °[place], [place] Pregnant women 18 years of age or older with a Medicaid card  °[facility] For children 0 - 27 years of age:  °• Cleaning °• Tooth brushing/flossing instruction °• Sealants, fillings, crowns °• Extractions °• Emergency treatment °Limited orthodontic services for patients with Medicaid [phone] °[address] °[place], [place] [address] Medicaid and [place] Health Choice cover for children up to age 27 and pregnant women.  Parents of children up to age 27 without Medicaid pay a reduced fee at time of service.  °[facility] [place] For children 0 - 27 years of age:  °• Cleaning °• Tooth brushing/flossing instruction °• Sealants, fillings, crowns °• Extractions °• Emergency treatment °Limited orthodontic services for patients with Medicaid  [phone] °[address] °[place], [place].  Medicaid and [place] Health Choice cover for children up to age 27 and pregnant women.  Parents of children up to age 27 without Medicaid pay a reduced fee.  °[facility] • Cleaning °• Sealants, fillings, crowns °• Extractions ° °Hours: Tuesdays and Thursdays, 4:15 - 8 pm [phone] °[address] °[place], [place] [address] Services free of charge to [place] residents ages 18-64 who do not have health insurance, Medicare, Medicaid, or VA benefits and fall within federal poverty guidelines  °[facility] ° ° ° Provides dental care in addition to primary medical care, nutritional counseling, and pharmacy: °• Cleaning °• Sealants, fillings, crowns °• Extractions ° ° ° ° ° ° ° ° ° ° ° ° ° ° ° ° ° [phone] °[facility], [address] °[place], [place] ° °[phone] °[facility], [address] [place], [place] ° °[phone] °[facility] °[place], [place] ° °[phone] °[facility], [address] °[place], [place] ° °[phone] °[facility] °[address] °[place], [place] Accepts Medicaid, Medicare, most insurance.  Also provides services available to all with fees adjusted based on ability to pay.    °[facility] • Cleaning °• Tooth brushing/flossing instruction °• Sealants, fillings, crowns °• Extractions °• Emergency treatment °Hours: Tuesdays, Thursdays, and Fridays from 8 am to 5 pm by appointment only. [phone] °[address] °[place], [place] [address] [place] residents with Medicaid (depending on eligibility) and children with [place] Health Choice - call for more information.  °  [facility] • Extractions only ° °Hours: 2nd and 4th Thursday of each month from 6:30 am - 9 am.   [phone] ext. [phone] °[address] °[place], [place] [address] Ages 18 and older only.  Patients are seen on a first come, first served basis.  °[facility] • Cleanings °• Fillings °• Extractions °• Orthodontics °• Endodontics °• Implants/Crowns/Bridges °• Complete and partial dentures [phone] °[place], [place] Patients must complete an application for services.  There is often a waiting list.   ° °

## 2015-11-24 ENCOUNTER — Emergency Department (HOSPITAL_COMMUNITY): Payer: No Typology Code available for payment source

## 2015-11-24 ENCOUNTER — Encounter (HOSPITAL_COMMUNITY): Payer: Self-pay | Admitting: Emergency Medicine

## 2015-11-24 ENCOUNTER — Emergency Department (HOSPITAL_COMMUNITY)
Admission: EM | Admit: 2015-11-24 | Discharge: 2015-11-24 | Disposition: A | Discharge status: Home / Self Care | Payer: Self-pay | Attending: Emergency Medicine | Admitting: Emergency Medicine

## 2015-11-24 DIAGNOSIS — R51 Headache: Secondary | ICD-10-CM

## 2015-11-24 DIAGNOSIS — R519 Headache, unspecified: Secondary | ICD-10-CM

## 2015-11-24 DIAGNOSIS — E860 Dehydration: Secondary | ICD-10-CM

## 2015-11-24 DIAGNOSIS — T673XXA Heat exhaustion, anhydrotic, initial encounter: Secondary | ICD-10-CM | POA: Insufficient documentation

## 2015-11-24 DIAGNOSIS — F329 Major depressive disorder, single episode, unspecified: Secondary | ICD-10-CM | POA: Insufficient documentation

## 2015-11-24 DIAGNOSIS — Z791 Long term (current) use of non-steroidal anti-inflammatories (NSAID): Secondary | ICD-10-CM | POA: Insufficient documentation

## 2015-11-24 DIAGNOSIS — T675XXA Heat exhaustion, unspecified, initial encounter: Secondary | ICD-10-CM

## 2015-11-24 DIAGNOSIS — F17210 Nicotine dependence, cigarettes, uncomplicated: Secondary | ICD-10-CM | POA: Insufficient documentation

## 2015-11-24 DIAGNOSIS — Z7982 Long term (current) use of aspirin: Secondary | ICD-10-CM | POA: Insufficient documentation

## 2015-11-24 LAB — COMPREHENSIVE METABOLIC PANEL
ALBUMIN: 4.8 g/dL (ref 3.5–5.0)
ALK PHOS: 58 U/L (ref 38–126)
ALT: 14 U/L — AB (ref 17–63)
ANION GAP: 14 (ref 5–15)
AST: 25 U/L (ref 15–41)
BILIRUBIN TOTAL: 0.5 mg/dL (ref 0.3–1.2)
BUN: 26 mg/dL — AB (ref 6–20)
CALCIUM: 8.7 mg/dL — AB (ref 8.9–10.3)
CO2: 19 mmol/L — AB (ref 22–32)
CREATININE: 0.9 mg/dL (ref 0.61–1.24)
Chloride: 106 mmol/L (ref 101–111)
GFR calc Af Amer: >60 mL/min (ref >60)
GFR calc non Af Amer: >60 mL/min (ref >60)
GLUCOSE: 57 mg/dL — AB (ref 65–99)
Potassium: 4.5 mmol/L (ref 3.5–5.1)
SODIUM: 139 mmol/L (ref 135–145)
TOTAL PROTEIN: 8.1 g/dL (ref 6.5–8.1)

## 2015-11-24 LAB — CBC WITH DIFFERENTIAL/PLATELET
Basophils Absolute: 0 10*3/uL (ref 0.0–0.1)
Basophils Relative: 0 %
EOS PCT: 0 %
Eosinophils Absolute: 0 10*3/uL (ref 0.0–0.7)
HEMATOCRIT: 44.4 % (ref 39.0–52.0)
HEMOGLOBIN: 14.7 g/dL (ref 13.0–17.0)
LYMPHS ABS: 1.1 10*3/uL (ref 0.7–4.0)
LYMPHS PCT: 4 %
MCH: 30.9 pg (ref 26.0–34.0)
MCHC: 33.1 g/dL (ref 30.0–36.0)
MCV: 93.5 fL (ref 78.0–100.0)
MONOS PCT: 6 %
Monocytes Absolute: 1.6 10*3/uL — ABNORMAL HIGH (ref 0.1–1.0)
NEUTROS ABS: 24.4 10*3/uL — AB (ref 1.7–7.7)
Neutrophils Relative %: 90 %
Platelets: 221 10*3/uL (ref 150–400)
RBC: 4.75 MIL/uL (ref 4.22–5.81)
RDW: 14.2 % (ref 11.5–15.5)
WBC: 27.1 10*3/uL — ABNORMAL HIGH (ref 4.0–10.5)

## 2015-11-24 LAB — CSF CELL COUNT WITH DIFFERENTIAL
RBC COUNT CSF: 1 /mm3 — AB
RBC Count, CSF: 207 /mm3 — ABNORMAL HIGH
TUBE #: 4
Tube #: 1
WBC CSF: 2 /mm3 (ref 0–5)
WBC CSF: 3 /mm3 (ref 0–5)

## 2015-11-24 LAB — MAGNESIUM: Magnesium: 2.1 mg/dL (ref 1.7–2.4)

## 2015-11-24 LAB — PROTEIN, CSF: TOTAL PROTEIN, CSF: 27 mg/dL (ref 15–45)

## 2015-11-24 LAB — GLUCOSE, CSF: GLUCOSE CSF: 44 mg/dL (ref 40–70)

## 2015-11-24 LAB — CK: Total CK: 288 U/L (ref 49–397)

## 2015-11-24 MED ORDER — SODIUM CHLORIDE 0.9 % IV BOLUS (SEPSIS)
1000.0000 mL | Freq: Once | INTRAVENOUS | Status: AC
  Administered 2015-11-24: 1000 mL via INTRAVENOUS

## 2015-11-24 MED ORDER — METOCLOPRAMIDE HCL 5 MG/ML IJ SOLN
10.0000 mg | Freq: Once | INTRAMUSCULAR | Status: AC
  Administered 2015-11-24: 10 mg via INTRAVENOUS
  Filled 2015-11-24: qty 2

## 2015-11-24 MED ORDER — KETOROLAC TROMETHAMINE 30 MG/ML IJ SOLN
30.0000 mg | Freq: Once | INTRAMUSCULAR | Status: AC
  Administered 2015-11-24: 30 mg via INTRAVENOUS
  Filled 2015-11-24: qty 1

## 2015-11-24 MED ORDER — LIDOCAINE-EPINEPHRINE (PF) 1 %-1:200000 IJ SOLN
20.0000 mL | Freq: Once | INTRAMUSCULAR | Status: AC
  Administered 2015-11-24: 20 mL
  Filled 2015-11-24: qty 30

## 2015-11-24 NOTE — ED Notes (Signed)
rn notified of lab result

## 2015-11-24 NOTE — Progress Notes (Signed)
Patient listed as not having insurance or a pcp.  EDCM spoke to patient at bedside.  Patient reports he has [employer] insurance, no pcp.  EDCM instructed patient to call the phone number on the back of his insurance card or go to [employer] company web site to assist him in finding a pcp who is close to him and within network.  Discussed importance and purpose of a pcp.  Patient verbalized understanding.  No further EDCM needs at this time.
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
## 2015-11-24 NOTE — ED Notes (Signed)
Bed: [id number] Expected date:  Expected time:  Means of arrival:  Comments: EMS- 25 yo HA/emesis

## 2015-11-24 NOTE — Discharge Instructions (Signed)
Continue to drink plenty of fluids at home and take Tylenol and ibuprofen as needed for pain control. Return without fail for worsening symptoms including escalating pain, intractable vomiting, fevers, or any other symptoms concerning to you.  Dehydration, Adult Dehydration means your body does not have as much fluid or water as it needs. It happens when you take in less fluid than you lose. Your kidneys, brain, and heart will not work properly without the right amount of fluids.  Dehydration can range from mild to severe. It should be treated right away to help prevent it from becoming severe. HOME CARE  Drink enough fluid to keep your pee (urine) clear or pale yellow.  Drink water or fluid slowly by taking small sips. You can also try sucking on ice cubes.  Have food or drinks that contain electrolytes. Examples include bananas and sports drinks.  Take over-the-counter and prescription medicines only as told by your doctor.  Prepare oral rehydration solution (ORS) according to the instructions that came with it. Take sips of ORS every 5 minutes until your pee returns to normal.  If you are throwing up (vomiting) or have watery poop (diarrhea), keep trying to drink water, ORS, or both.  If you have watery poop, avoid:  Drinks with caffeine.  Fruit juice.  Milk.  Carbonated soft drinks.  Do not take salt tablets. This can lead to having too much sodium in your body (hypernatremia). GET HELP IF:  You cannot eat or drink without throwing up.  You have had mild watery poop for longer than 24 hours.  You have a fever. GET HELP RIGHT AWAY IF:   You have very strong thirst.  You have very bad watery poop.  You have not peed in 6-8 hours, or you have peed only a small amount of very dark pee.  You have shriveled skin.  You are dizzy, confused, or both.   This information is not intended to replace advice given to you by your health care provider. Make sure you discuss any  questions you have with your health care provider.   Document Released: 02/16/2009 Document Revised: 01/11/2015 Document Reviewed: 09/07/2014 Elsevier Interactive Patient Education [employer]2016 Elsevier Inc.

## 2015-11-24 NOTE — ED Notes (Signed)
Patient mowing grass yesterday-States he was out for about 3 hours.  Went to sleep and woke up throwing up with headache and dizziness.  Last time patient ate was yesterday at 4pm.  Reports decreased urine output since yesterday.

## 2015-11-28 LAB — CSF CULTURE W GRAM STAIN

## 2015-11-28 LAB — CSF CULTURE
CULTURE: NO GROWTH
GRAM STAIN: NONE SEEN

## 2016-02-20 ENCOUNTER — Emergency Department (HOSPITAL_COMMUNITY)
Admission: EM | Admit: 2016-02-20 | Discharge: 2016-02-20 | Disposition: A | Discharge status: Home / Self Care | Payer: Self-pay | Attending: Emergency Medicine | Admitting: Emergency Medicine

## 2016-02-20 DIAGNOSIS — Z7982 Long term (current) use of aspirin: Secondary | ICD-10-CM | POA: Insufficient documentation

## 2016-02-20 DIAGNOSIS — F17210 Nicotine dependence, cigarettes, uncomplicated: Secondary | ICD-10-CM | POA: Insufficient documentation

## 2016-02-20 DIAGNOSIS — F10120 Alcohol abuse with intoxication, uncomplicated: Secondary | ICD-10-CM | POA: Insufficient documentation

## 2016-02-20 DIAGNOSIS — Z79899 Other long term (current) drug therapy: Secondary | ICD-10-CM | POA: Insufficient documentation

## 2016-02-20 DIAGNOSIS — Z91010 Allergy to peanuts: Secondary | ICD-10-CM | POA: Insufficient documentation

## 2016-02-20 DIAGNOSIS — F10920 Alcohol use, unspecified with intoxication, uncomplicated: Secondary | ICD-10-CM

## 2016-02-20 LAB — COMPREHENSIVE METABOLIC PANEL
ALBUMIN: 4.5 g/dL (ref 3.5–5.0)
ALT: 16 U/L — ABNORMAL LOW (ref 17–63)
ANION GAP: 9 (ref 5–15)
AST: 31 U/L (ref 15–41)
Alkaline Phosphatase: 58 U/L (ref 38–126)
BUN: 15 mg/dL (ref 6–20)
CALCIUM: 8.5 mg/dL — AB (ref 8.9–10.3)
CHLORIDE: 109 mmol/L (ref 101–111)
CO2: 24 mmol/L (ref 22–32)
Creatinine, Ser: 0.8 mg/dL (ref 0.61–1.24)
GFR calc non Af Amer: >60 mL/min (ref >60)
Glucose, Bld: 94 mg/dL (ref 65–99)
POTASSIUM: 4 mmol/L (ref 3.5–5.1)
SODIUM: 142 mmol/L (ref 135–145)
Total Bilirubin: 0.7 mg/dL (ref 0.3–1.2)
Total Protein: 7.6 g/dL (ref 6.5–8.1)

## 2016-02-20 LAB — CBC WITH DIFFERENTIAL/PLATELET
Basophils Absolute: 0.1 10*3/uL (ref 0.0–0.1)
Basophils Relative: 1 %
EOS ABS: 0.2 10*3/uL (ref 0.0–0.7)
Eosinophils Relative: 1 %
HEMATOCRIT: 40.3 % (ref 39.0–52.0)
HEMOGLOBIN: 13.7 g/dL (ref 13.0–17.0)
LYMPHS ABS: 3.1 10*3/uL (ref 0.7–4.0)
Lymphocytes Relative: 27 %
MCH: 31.3 pg (ref 26.0–34.0)
MCHC: 34 g/dL (ref 30.0–36.0)
MCV: 92 fL (ref 78.0–100.0)
Monocytes Absolute: 0.9 10*3/uL (ref 0.1–1.0)
Monocytes Relative: 8 %
NEUTROS ABS: 7.1 10*3/uL (ref 1.7–7.7)
NEUTROS PCT: 63 %
Platelets: 307 10*3/uL (ref 150–400)
RBC: 4.38 MIL/uL (ref 4.22–5.81)
RDW: 14.2 % (ref 11.5–15.5)
WBC: 11.3 10*3/uL — AB (ref 4.0–10.5)

## 2016-02-20 LAB — ACETAMINOPHEN LEVEL

## 2016-02-20 LAB — ETHANOL: Alcohol, Ethyl (B): 304 mg/dL (ref <5)

## 2016-02-20 LAB — SALICYLATE LEVEL

## 2016-02-20 MED ORDER — SODIUM CHLORIDE 0.9 % IV BOLUS (SEPSIS)
1000.0000 mL | Freq: Once | INTRAVENOUS | Status: AC
  Administered 2016-02-20: 1000 mL via INTRAVENOUS
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
## 2016-02-20 NOTE — ED Notes (Signed)
Pt alert and oriented upon being woken up

## 2016-02-20 NOTE — ED Notes (Signed)
Pt briefly woke up, took all of his monitoring equipment off, and said he needed to sleep.
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
## 2017-08-22 ENCOUNTER — Emergency Department (HOSPITAL_COMMUNITY): Payer: Self-pay

## 2017-08-22 ENCOUNTER — Emergency Department (HOSPITAL_COMMUNITY)
Admission: EM | Admit: 2017-08-22 | Discharge: 2017-08-22 | Disposition: A | Discharge status: Home / Self Care | Payer: Self-pay | Attending: Emergency Medicine | Admitting: Emergency Medicine

## 2017-08-22 ENCOUNTER — Encounter (HOSPITAL_COMMUNITY): Payer: Self-pay

## 2017-08-22 DIAGNOSIS — S060X1A Concussion with loss of consciousness of 30 minutes or less, initial encounter: Secondary | ICD-10-CM | POA: Insufficient documentation

## 2017-08-22 DIAGNOSIS — F1210 Cannabis abuse, uncomplicated: Secondary | ICD-10-CM | POA: Insufficient documentation

## 2017-08-22 DIAGNOSIS — Y939 Activity, unspecified: Secondary | ICD-10-CM | POA: Insufficient documentation

## 2017-08-22 DIAGNOSIS — F10120 Alcohol abuse with intoxication, uncomplicated: Secondary | ICD-10-CM | POA: Insufficient documentation

## 2017-08-22 DIAGNOSIS — Y908 Blood alcohol level of 240 mg/100 ml or more: Secondary | ICD-10-CM | POA: Insufficient documentation

## 2017-08-22 DIAGNOSIS — F17210 Nicotine dependence, cigarettes, uncomplicated: Secondary | ICD-10-CM | POA: Insufficient documentation

## 2017-08-22 DIAGNOSIS — Z23 Encounter for immunization: Secondary | ICD-10-CM | POA: Insufficient documentation

## 2017-08-22 DIAGNOSIS — F1010 Alcohol abuse, uncomplicated: Secondary | ICD-10-CM

## 2017-08-22 DIAGNOSIS — S01511A Laceration without foreign body of lip, initial encounter: Secondary | ICD-10-CM | POA: Insufficient documentation

## 2017-08-22 DIAGNOSIS — R4182 Altered mental status, unspecified: Secondary | ICD-10-CM | POA: Insufficient documentation

## 2017-08-22 DIAGNOSIS — Y929 Unspecified place or not applicable: Secondary | ICD-10-CM | POA: Insufficient documentation

## 2017-08-22 DIAGNOSIS — Y999 Unspecified external cause status: Secondary | ICD-10-CM | POA: Insufficient documentation

## 2017-08-22 DIAGNOSIS — S0993XA Unspecified injury of face, initial encounter: Secondary | ICD-10-CM

## 2017-08-22 LAB — PROTIME-INR
INR: 1
PROTHROMBIN TIME: 13.1 s (ref 11.4–15.2)

## 2017-08-22 LAB — CBC
HEMATOCRIT: 41.2 % (ref 39.0–52.0)
HEMOGLOBIN: 14 g/dL (ref 13.0–17.0)
MCH: 31.1 pg (ref 26.0–34.0)
MCHC: 34 g/dL (ref 30.0–36.0)
MCV: 91.6 fL (ref 78.0–100.0)
Platelets: 261 10*3/uL (ref 150–400)
RBC: 4.5 MIL/uL (ref 4.22–5.81)
RDW: 14 % (ref 11.5–15.5)
WBC: 9.9 10*3/uL (ref 4.0–10.5)

## 2017-08-22 LAB — CDS SEROLOGY

## 2017-08-22 LAB — SAMPLE TO BLOOD BANK

## 2017-08-22 LAB — COMPREHENSIVE METABOLIC PANEL
ALBUMIN: 4.5 g/dL (ref 3.5–5.0)
ALT: 20 U/L (ref 17–63)
AST: 30 U/L (ref 15–41)
Alkaline Phosphatase: 53 U/L (ref 38–126)
Anion gap: 12 (ref 5–15)
BUN: 14 mg/dL (ref 6–20)
CALCIUM: 9 mg/dL (ref 8.9–10.3)
CO2: 22 mmol/L (ref 22–32)
Chloride: 109 mmol/L (ref 101–111)
Creatinine, Ser: 0.69 mg/dL (ref 0.61–1.24)
GFR calc non Af Amer: >60 mL/min (ref >60)
GLUCOSE: 91 mg/dL (ref 65–99)
POTASSIUM: 3.6 mmol/L (ref 3.5–5.1)
SODIUM: 143 mmol/L (ref 135–145)
TOTAL PROTEIN: 7.8 g/dL (ref 6.5–8.1)
Total Bilirubin: 0.3 mg/dL (ref 0.3–1.2)

## 2017-08-22 LAB — CBG MONITORING, ED: GLUCOSE-CAPILLARY: 93 mg/dL (ref 65–99)

## 2017-08-22 LAB — ETHANOL: ALCOHOL ETHYL (B): 266 mg/dL — AB (ref <10)

## 2017-08-22 MED ORDER — LIDOCAINE-EPINEPHRINE-TETRACAINE (LET) SOLUTION
3.0000 mL | Freq: Once | NASAL | Status: AC
  Administered 2017-08-22: 3 mL via TOPICAL
  Filled 2017-08-22: qty 3

## 2017-08-22 MED ORDER — ONDANSETRON HCL 4 MG/2ML IJ SOLN
4.0000 mg | Freq: Once | INTRAMUSCULAR | Status: AC
  Administered 2017-08-22: 4 mg via INTRAVENOUS
  Filled 2017-08-22: qty 2

## 2017-08-22 MED ORDER — LIDOCAINE-EPINEPHRINE (PF) 2 %-1:200000 IJ SOLN
20.0000 mL | Freq: Once | INTRAMUSCULAR | Status: AC
  Administered 2017-08-22: 20 mL
  Filled 2017-08-22: qty 20

## 2017-08-22 MED ORDER — TETANUS-DIPHTH-ACELL PERTUSSIS 5-2.5-18.5 LF-MCG/0.5 IM SUSP
0.5000 mL | Freq: Once | INTRAMUSCULAR | Status: AC
  Administered 2017-08-22: 0.5 mL via INTRAMUSCULAR
  Filled 2017-08-22: qty 0.5

## 2017-08-22 MED ORDER — TETANUS-DIPHTH-ACELL PERTUSSIS 5-2.5-18.5 LF-MCG/0.5 IM SUSP: 0.5000 mL | Freq: Once | INTRAMUSCULAR | Status: DC
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
## 2017-08-22 NOTE — ED Notes (Signed)
Pt ambulated in hallway with no complications.
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
## 2018-02-01 ENCOUNTER — Emergency Department (HOSPITAL_COMMUNITY): Payer: Self-pay

## 2018-02-01 ENCOUNTER — Other Ambulatory Visit: Payer: Self-pay

## 2018-02-01 ENCOUNTER — Inpatient Hospital Stay (HOSPITAL_COMMUNITY)
Admission: EM | Admit: 2018-02-01 | Discharge: 2018-02-03 | DRG: 917 | Disposition: A | Discharge status: Home / Self Care | Payer: Self-pay | Attending: Pulmonary Disease | Admitting: Pulmonary Disease

## 2018-02-01 ENCOUNTER — Encounter (HOSPITAL_COMMUNITY): Payer: Self-pay

## 2018-02-01 DIAGNOSIS — F10921 Alcohol use, unspecified with intoxication delirium: Secondary | ICD-10-CM

## 2018-02-01 DIAGNOSIS — G92 Toxic encephalopathy: Secondary | ICD-10-CM | POA: Diagnosis present

## 2018-02-01 DIAGNOSIS — F1920 Other psychoactive substance dependence, uncomplicated: Secondary | ICD-10-CM | POA: Diagnosis present

## 2018-02-01 DIAGNOSIS — T50904A Poisoning by unspecified drugs, medicaments and biological substances, undetermined, initial encounter: Secondary | ICD-10-CM

## 2018-02-01 DIAGNOSIS — Y908 Blood alcohol level of 240 mg/100 ml or more: Secondary | ICD-10-CM | POA: Diagnosis present

## 2018-02-01 DIAGNOSIS — F17210 Nicotine dependence, cigarettes, uncomplicated: Secondary | ICD-10-CM | POA: Diagnosis present

## 2018-02-01 DIAGNOSIS — F10121 Alcohol abuse with intoxication delirium: Secondary | ICD-10-CM | POA: Diagnosis present

## 2018-02-01 DIAGNOSIS — F19921 Other psychoactive substance use, unspecified with intoxication with delirium: Secondary | ICD-10-CM

## 2018-02-01 DIAGNOSIS — J9600 Acute respiratory failure, unspecified whether with hypoxia or hypercapnia: Secondary | ICD-10-CM | POA: Diagnosis present

## 2018-02-01 DIAGNOSIS — T43641A Poisoning by ecstasy, accidental (unintentional), initial encounter: Principal | ICD-10-CM | POA: Diagnosis present

## 2018-02-01 DIAGNOSIS — M6282 Rhabdomyolysis: Secondary | ICD-10-CM | POA: Diagnosis present

## 2018-02-01 DIAGNOSIS — F1910 Other psychoactive substance abuse, uncomplicated: Secondary | ICD-10-CM

## 2018-02-01 DIAGNOSIS — T43621A Poisoning by amphetamines, accidental (unintentional), initial encounter: Secondary | ICD-10-CM | POA: Diagnosis present

## 2018-02-01 DIAGNOSIS — J9601 Acute respiratory failure with hypoxia: Secondary | ICD-10-CM | POA: Diagnosis present

## 2018-02-01 DIAGNOSIS — F329 Major depressive disorder, single episode, unspecified: Secondary | ICD-10-CM | POA: Diagnosis present

## 2018-02-01 LAB — BLOOD GAS, ARTERIAL
ACID-BASE EXCESS: 0.9 mmol/L (ref 0.0–2.0)
Bicarbonate: 24.2 mmol/L (ref 20.0–28.0)
DRAWN BY: 11249
FIO2: 100
O2 SAT: 99.7 %
PEEP/CPAP: 5 cmH2O
PH ART: 7.455 — AB (ref 7.350–7.450)
Patient temperature: 97.3
RATE: 14 resp/min
VT: 580 mL
pCO2 arterial: 34.6 mmHg (ref 32.0–48.0)
pO2, Arterial: 523 mmHg — ABNORMAL HIGH (ref 83.0–108.0)

## 2018-02-01 LAB — CBC WITH DIFFERENTIAL/PLATELET
Basophils Absolute: 0.1 10*3/uL (ref 0.0–0.1)
Basophils Relative: 1 %
Eosinophils Absolute: 0.1 10*3/uL (ref 0.0–0.7)
Eosinophils Relative: 1 %
HCT: 43.2 % (ref 39.0–52.0)
HEMOGLOBIN: 14.8 g/dL (ref 13.0–17.0)
LYMPHS ABS: 2.8 10*3/uL (ref 0.7–4.0)
Lymphocytes Relative: 26 %
MCH: 31.5 pg (ref 26.0–34.0)
MCHC: 34.3 g/dL (ref 30.0–36.0)
MCV: 91.9 fL (ref 78.0–100.0)
MONOS PCT: 9 %
Monocytes Absolute: 1 10*3/uL (ref 0.1–1.0)
NEUTROS ABS: 6.8 10*3/uL (ref 1.7–7.7)
NEUTROS PCT: 63 %
Platelets: 269 10*3/uL (ref 150–400)
RBC: 4.7 MIL/uL (ref 4.22–5.81)
RDW: 13.3 % (ref 11.5–15.5)
WBC: 10.7 10*3/uL — ABNORMAL HIGH (ref 4.0–10.5)

## 2018-02-01 LAB — COMPREHENSIVE METABOLIC PANEL
ALT: 19 U/L (ref 0–44)
ANION GAP: 12 (ref 5–15)
AST: 29 U/L (ref 15–41)
Albumin: 4.6 g/dL (ref 3.5–5.0)
Alkaline Phosphatase: 55 U/L (ref 38–126)
BUN: 14 mg/dL (ref 6–20)
CHLORIDE: 110 mmol/L (ref 98–111)
CO2: 25 mmol/L (ref 22–32)
Calcium: 9.2 mg/dL (ref 8.9–10.3)
Creatinine, Ser: 0.9 mg/dL (ref 0.61–1.24)
Glucose, Bld: 93 mg/dL (ref 70–99)
POTASSIUM: 3.9 mmol/L (ref 3.5–5.1)
SODIUM: 147 mmol/L — AB (ref 135–145)
Total Bilirubin: 0.6 mg/dL (ref 0.3–1.2)
Total Protein: 8.2 g/dL — ABNORMAL HIGH (ref 6.5–8.1)

## 2018-02-01 LAB — MRSA PCR SCREENING: MRSA by PCR: NEGATIVE

## 2018-02-01 LAB — ETHANOL: ALCOHOL ETHYL (B): 259 mg/dL — AB (ref <10)

## 2018-02-01 LAB — RAPID URINE DRUG SCREEN, HOSP PERFORMED
AMPHETAMINES: POSITIVE — AB
BARBITURATES: NOT DETECTED
Benzodiazepines: POSITIVE — AB
COCAINE: NOT DETECTED
OPIATES: NOT DETECTED
TETRAHYDROCANNABINOL: POSITIVE — AB

## 2018-02-01 LAB — TRIGLYCERIDES: TRIGLYCERIDES: 46 mg/dL (ref <150)

## 2018-02-01 LAB — TROPONIN I: Troponin I: <0.03 ng/mL (ref <0.03)

## 2018-02-01 MED ORDER — ROCURONIUM BROMIDE 50 MG/5ML IV SOLN
0.6000 mg/kg | Freq: Once | INTRAVENOUS | Status: AC
  Administered 2018-02-01: 43.6 mg via INTRAVENOUS

## 2018-02-01 MED ORDER — FENTANYL 2500MCG IN NS 250ML (10MCG/ML) PREMIX INFUSION
0.0000 ug/h | INTRAVENOUS | Status: DC
  Administered 2018-02-01: 25 ug/h via INTRAVENOUS
  Administered 2018-02-02: 250 ug/h via INTRAVENOUS
  Filled 2018-02-01 (×2): qty 250

## 2018-02-01 MED ORDER — ONDANSETRON HCL 4 MG/2ML IJ SOLN
4.0000 mg | Freq: Four times a day (QID) | INTRAMUSCULAR | Status: DC | PRN
  Administered 2018-02-02: 4 mg via INTRAVENOUS
  Filled 2018-02-01: qty 2

## 2018-02-01 MED ORDER — FENTANYL CITRATE (PF) 100 MCG/2ML IJ SOLN
100.0000 ug | INTRAMUSCULAR | Status: DC | PRN
  Administered 2018-02-01 (×2): 100 ug via INTRAVENOUS

## 2018-02-01 MED ORDER — SUCCINYLCHOLINE CHLORIDE 20 MG/ML IJ SOLN
INTRAMUSCULAR | Status: AC | PRN
  Administered 2018-02-01: 100 mg via INTRAVENOUS

## 2018-02-01 MED ORDER — ONDANSETRON HCL 4 MG/2ML IJ SOLN
4.0000 mg | Freq: Once | INTRAMUSCULAR | Status: AC
  Administered 2018-02-01: 4 mg via INTRAVENOUS

## 2018-02-01 MED ORDER — FENTANYL CITRATE (PF) 100 MCG/2ML IJ SOLN
100.0000 ug | INTRAMUSCULAR | Status: DC | PRN
  Administered 2018-02-01 (×2): 100 ug via INTRAVENOUS
  Filled 2018-02-01 (×4): qty 2

## 2018-02-01 MED ORDER — ONDANSETRON HCL 4 MG/2ML IJ SOLN
INTRAMUSCULAR | Status: AC
  Filled 2018-02-01: qty 2

## 2018-02-01 MED ORDER — DEXTROSE-NACL 5-0.45 % IV SOLN
INTRAVENOUS | Status: DC
  Administered 2018-02-01 – 2018-02-03 (×3): via INTRAVENOUS

## 2018-02-01 MED ORDER — PROPOFOL 1000 MG/100ML IV EMUL
0.0000 ug/kg/min | INTRAVENOUS | Status: DC
  Administered 2018-02-01: 5 ug/kg/min via INTRAVENOUS
  Filled 2018-02-01: qty 100

## 2018-02-01 MED ORDER — ARTIFICIAL TEARS OPHTHALMIC OINT
1.0000 "application " | TOPICAL_OINTMENT | Freq: Three times a day (TID) | OPHTHALMIC | Status: DC
  Administered 2018-02-01 (×2): 1 via OPHTHALMIC
  Filled 2018-02-01: qty 3.5

## 2018-02-01 MED ORDER — SODIUM CHLORIDE 0.9 % IV SOLN
INTRAVENOUS | Status: AC | PRN
  Administered 2018-02-01: 1000 mL/h via INTRAVENOUS

## 2018-02-01 MED ORDER — ETOMIDATE 2 MG/ML IV SOLN
INTRAVENOUS | Status: AC | PRN
  Administered 2018-02-01: 20 mg via INTRAVENOUS

## 2018-02-01 MED ORDER — CISATRACURIUM BOLUS VIA INFUSION
0.1000 mg/kg | Freq: Once | INTRAVENOUS | Status: AC
  Administered 2018-02-01: 7.3 mg via INTRAVENOUS
  Filled 2018-02-01: qty 8

## 2018-02-01 MED ORDER — PANTOPRAZOLE SODIUM 40 MG PO PACK
40.0000 mg | PACK | Freq: Every day | ORAL | Status: DC
  Administered 2018-02-01 – 2018-02-02 (×2): 40 mg
  Filled 2018-02-01 (×2): qty 20

## 2018-02-01 MED ORDER — ROCURONIUM BROMIDE 50 MG/5ML IV SOLN: 0.0040 mg/kg/h | INTRAVENOUS | Status: DC | PRN

## 2018-02-01 MED ORDER — ACETAMINOPHEN 325 MG PO TABS: 650.0000 mg | ORAL_TABLET | ORAL | Status: DC | PRN

## 2018-02-01 MED ORDER — PROPOFOL 1000 MG/100ML IV EMUL
25.0000 ug/kg/min | INTRAVENOUS | Status: DC
  Administered 2018-02-01: 79.89 ug/kg/min via INTRAVENOUS
  Administered 2018-02-01: 70 ug/kg/min via INTRAVENOUS
  Administered 2018-02-01: 79.89 ug/kg/min via INTRAVENOUS
  Administered 2018-02-01: 60 ug/kg/min via INTRAVENOUS
  Administered 2018-02-01: 80 ug/kg/min via INTRAVENOUS
  Administered 2018-02-01: 45.914 ug/kg/min via INTRAVENOUS
  Administered 2018-02-01: 80 ug/kg/min via INTRAVENOUS
  Administered 2018-02-02: 40 ug/kg/min via INTRAVENOUS
  Administered 2018-02-02: 70 ug/kg/min via INTRAVENOUS
  Administered 2018-02-02: 80 ug/kg/min via INTRAVENOUS
  Filled 2018-02-01 (×9): qty 100

## 2018-02-01 MED ORDER — ENOXAPARIN SODIUM 40 MG/0.4ML ~~LOC~~ SOLN
40.0000 mg | SUBCUTANEOUS | Status: DC
  Administered 2018-02-01 – 2018-02-02 (×2): 40 mg via SUBCUTANEOUS
  Filled 2018-02-01 (×2): qty 0.4

## 2018-02-01 MED ORDER — SODIUM CHLORIDE 0.9 % IV SOLN
0.0000 ug/kg/min | INTRAVENOUS | Status: DC
  Administered 2018-02-01: 3 ug/kg/min via INTRAVENOUS
  Filled 2018-02-01: qty 20

## 2018-02-01 NOTE — Progress Notes (Signed)
Patient transported to CT without incident.
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
## 2018-02-01 NOTE — ED Notes (Signed)
Condom cath placed on pt to get urine sample
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
## 2018-02-01 NOTE — Sedation Documentation (Signed)
OG tube placed, air pushed to confirm placement

## 2018-02-01 NOTE — ED Notes (Signed)
Bed: WA11 Expected date:  Expected time:  Means of arrival:  Comments: overdose 

## 2018-02-01 NOTE — ED Notes (Signed)
ED TO INPATIENT HANDOFF REPORT  Name/Age/Gender Jaime Butler 27 y.o. male  Code Status    Code Status Orders  (From admission, onward)         Start     Ordered   02/01/18 0755  Full code  Continuous     02/01/18 0757        Code Status History    Date Active Date Inactive Code Status Order ID Comments User Context   12/15/2013 1742 12/16/2013 2025 Full Code [id number]  Clarene Reamer, MD Inpatient   12/15/2013 0855 12/15/2013 1742 Full Code [id number]  Leota Jacobsen, MD ED   12/15/2013 0855 12/15/2013 0855 Full Code [id number]  Leota Jacobsen, MD ED   07/19/2013 0935 07/19/2013 1719 Full Code [id number]  Nicholaus Bloom, MD Inpatient   07/16/2013 1859 07/19/2013 0935 Full Code [id number]  Kathlen Brunswick, RN Inpatient      Home/SNF/Other Home  Chief Complaint overdose  Level of Care/Admitting Diagnosis ED Disposition    ED Disposition Condition [place] [facility] Area: [facility] [[id number]]  Level of Care: ICU [6]  Diagnosis: Acute respiratory failure ([place]) [542.70.ICD-9-CM]  Admitting Physician: Rigoberto Noel [[id number]]  Attending Physician: Rigoberto Noel [[id number]]  Estimated length of stay: past midnight tomorrow  Certification:: I certify this patient will need inpatient services for at least 2 midnights  PT Class (Do Not Modify): Inpatient [101]  PT Acc Code (Do Not Modify): Private [1]       Medical History Past Medical History:  Diagnosis Date  . Alcohol abuse   . Chest pain   . Depression   . Epistaxis     Allergies Allergies  Allergen Reactions  . Bee Venom Anaphylaxis  . Peanut-Containing Drug Products Anaphylaxis    IV Location/Drains/Wounds Patient Lines/Drains/Airways Status   Active Line/Drains/Airways    Name:   Placement date:   Placement time:   Site:   Days:   Peripheral IV 02/01/18 Right Forearm   02/01/18    0246    Forearm   less than 1   Peripheral IV 02/01/18 Left Forearm   02/01/18    0359    Forearm   less  than 1   Peripheral IV 02/01/18 Right Antecubital   02/01/18    0403    Antecubital   less than 1   Airway 7.5 mm   02/01/18    0330     less than 1          Labs/Imaging Results for orders placed or performed during the hospital encounter of 02/01/18 (from the past 48 hour(s))  Ethanol     Status: Abnormal   Collection Time: 02/01/18  3:17 AM  Result Value Ref Range   Alcohol, Ethyl (B) 259 (H) <10 mg/dL    Comment: (NOTE) Lowest detectable limit for serum alcohol is 10 mg/dL. For medical purposes only. Performed at [facility], [place] [address] [place], [place] [address]   Comprehensive metabolic panel     Status: Abnormal   Collection Time: 02/01/18  3:17 AM  Result Value Ref Range   Sodium 147 (H) 135 - 145 mmol/L   Potassium 3.9 3.5 - 5.1 mmol/L   Chloride 110 98 - 111 mmol/L   CO2 25 22 - 32 mmol/L   Glucose, Bld 93 70 - 99 mg/dL   BUN 14 6 - 20 mg/dL   Creatinine, Ser 0.90 0.61 - 1.24 mg/dL   Calcium  9.2 8.9 - 10.3 mg/dL   Total Protein 8.2 (H) 6.5 - 8.1 g/dL   Albumin 4.6 3.5 - 5.0 g/dL   AST 29 15 - 41 U/L   ALT 19 0 - 44 U/L   Alkaline Phosphatase 55 38 - 126 U/L   Total Bilirubin 0.6 0.3 - 1.2 mg/dL   GFR calc non Af Amer >60 >60 mL/min   GFR calc Af Amer >60 >60 mL/min    Comment: (NOTE) The eGFR has been calculated using the CKD EPI equation. This calculation has not been validated in all clinical situations. eGFR's persistently <60 mL/min signify possible Chronic Kidney Disease.    Anion gap 12 5 - 15    Comment: Performed at [facility], [place] [address], [place], [place] [address]  CBC with Differential/Platelet     Status: Abnormal   Collection Time: 02/01/18  3:17 AM  Result Value Ref Range   WBC 10.7 (H) 4.0 - 10.5 K/uL   RBC 4.70 4.22 - 5.81 MIL/uL   Hemoglobin 14.8 13.0 - 17.0 g/dL   HCT 43.2 39.0 - 52.0 %   MCV 91.9 78.0 - 100.0 fL   MCH 31.5 26.0 - 34.0 pg   MCHC 34.3 30.0 - 36.0 g/dL   RDW 13.3 11.5  - 15.5 %   Platelets 269 150 - 400 K/uL   Neutrophils Relative % 63 %   Neutro Abs 6.8 1.7 - 7.7 K/uL   Lymphocytes Relative 26 %   Lymphs Abs 2.8 0.7 - 4.0 K/uL   Monocytes Relative 9 %   Monocytes Absolute 1.0 0.1 - 1.0 K/uL   Eosinophils Relative 1 %   Eosinophils Absolute 0.1 0.0 - 0.7 K/uL   Basophils Relative 1 %   Basophils Absolute 0.1 0.0 - 0.1 K/uL    Comment: Performed at [facility], [place] [address] [place], [place] [address]  Triglycerides     Status: None   Collection Time: 02/01/18  3:17 AM  Result Value Ref Range   Triglycerides 46 <150 mg/dL    Comment: Performed at [facility], [place] [address] [place], [place] [address]  Rapid urine drug screen (hospital performed)     Status: Abnormal   Collection Time: 02/01/18  3:34 AM  Result Value Ref Range   Opiates NONE DETECTED NONE DETECTED   Cocaine NONE DETECTED NONE DETECTED   Benzodiazepines POSITIVE (A) NONE DETECTED   Amphetamines POSITIVE (A) NONE DETECTED   Tetrahydrocannabinol POSITIVE (A) NONE DETECTED   Barbiturates NONE DETECTED NONE DETECTED    Comment: (NOTE) DRUG SCREEN FOR MEDICAL PURPOSES ONLY.  IF CONFIRMATION IS NEEDED FOR ANY PURPOSE, NOTIFY LAB WITHIN 5 DAYS. LOWEST DETECTABLE LIMITS FOR URINE DRUG SCREEN Drug Class                     Cutoff (ng/mL) Amphetamine and metabolites    1000 Barbiturate and metabolites    200 Benzodiazepine                 809 Tricyclics and metabolites     300 Opiates and metabolites        300 Cocaine and metabolites        300 THC                            50 Performed at [facility], [place] [address] [place], [place] [address]   Blood  gas, arterial     Status: Abnormal   Collection Time: 02/01/18  5:25 AM  Result Value Ref Range   FIO2 100.00    Delivery systems VENTILATOR    Mode PRESSURE REGULATED VOLUME CONTROL    VT 580 mL   LHR 14 resp/min   Peep/cpap 5.0 cm H20   pH, Arterial 7.455 (H)  7.350 - 7.450   pCO2 arterial 34.6 32.0 - 48.0 mmHg   pO2, Arterial 523 (H) 83.0 - 108.0 mmHg   Bicarbonate 24.2 20.0 - 28.0 mmol/L   Acid-Base Excess 0.9 0.0 - 2.0 mmol/L   O2 Saturation 99.7 %   Patient temperature 97.3    Collection site RIGHT RADIAL    Drawn by [id number]    Sample type ARTERIAL DRAW    Allens test (pass/fail) PASS PASS    Comment: Performed at [facility], [place] [address], [place], [place] [address]   Dg Abdomen 1 View  Result Date: 02/01/2018 CLINICAL DATA:  27 year old male status post intubation and NG tube placement. EXAM: PORTABLE CHEST 1 VIEW COMPARISON:  None. FINDINGS: Endotracheal tube with tip approximately 15 mm above the carina. Recommend retraction by approximately 2-3 cm for optimal positioning. Enteric tube extends below the diaphragm and makes a single loop in the stomach with tip in the gastric fundus. The lungs are clear. There is no pleural effusion or pneumothorax. The cardiac silhouette is within normal limits. No bowel dilatation or evidence of obstruction. No free air. The osseous structures and soft tissues are grossly unremarkable. IMPRESSION: Endotracheal tube approximately 15 mm above the carina and enteric tube the tip in the gastric fundus. Electronically Signed   By: Anner Crete M.D.   On: 02/01/2018 04:39   Ct Head Wo Contrast  Result Date: 02/01/2018 CLINICAL DATA:  27 year old male with unresponsiveness and altered mental status. EXAM: CT HEAD WITHOUT CONTRAST TECHNIQUE: Contiguous axial images were obtained from the base of the skull through the vertex without intravenous contrast. COMPARISON:  Head CT dated 08/22/2017 FINDINGS: Brain: No evidence of acute infarction, hemorrhage, hydrocephalus, extra-axial collection or mass lesion/mass effect. Vascular: No hyperdense vessel or unexpected calcification. Skull: Normal. Negative for fracture or focal lesion. Sinuses/Orbits: There is mucoperiosteal thickening of paranasal  sinuses with opacification of the posterior nasal passages and nasopharynx. No air-fluid levels. Other: None IMPRESSION: No acute intracranial pathology. Electronically Signed   By: Anner Crete M.D.   On: 02/01/2018 05:36   Dg Chest Port 1 View  Result Date: 02/01/2018 CLINICAL DATA:  27 year old male status post intubation and NG tube placement. EXAM: PORTABLE CHEST 1 VIEW COMPARISON:  None. FINDINGS: Endotracheal tube with tip approximately 15 mm above the carina. Recommend retraction by approximately 2-3 cm for optimal positioning. Enteric tube extends below the diaphragm and makes a single loop in the stomach with tip in the gastric fundus. The lungs are clear. There is no pleural effusion or pneumothorax. The cardiac silhouette is within normal limits. No bowel dilatation or evidence of obstruction. No free air. The osseous structures and soft tissues are grossly unremarkable. IMPRESSION: Endotracheal tube approximately 15 mm above the carina and enteric tube the tip in the gastric fundus. Electronically Signed   By: Anner Crete M.D.   On: 02/01/2018 04:39    Pending Labs Unresulted Labs (From admission, onward)    Start     Ordered   02/02/18 0500  CBC  Tomorrow morning,   R     02/01/18 0757   02/02/18 0500  Basic metabolic panel  Tomorrow morning,   R     02/01/18 0757   02/02/18 0500  Magnesium  Tomorrow morning,   R     02/01/18 0757   02/02/18 0500  Phosphorus  Tomorrow morning,   R     02/01/18 0757   02/01/18 0757  Troponin I  Add-on,   R     02/01/18 0757   02/01/18 0754  HIV antibody (Routine Testing)  Once,   R     02/01/18 0757   02/01/18 0343  Triglycerides  (propofol (DIPRIVAN))  Every 72 hours,   R    Comments:  While on propofol (DIPRIVAN)    02/01/18 0343          Vitals/Pain Today's Vitals   02/01/18 0800 02/01/18 0830 02/01/18 0835 02/01/18 0845  BP: 114/90 (!) 148/101  125/90  Pulse: 77 90  77  Resp: 14 20  14   Temp:      TempSrc:      SpO2:  100% 100% 100% 100%  Weight:      Height:        Isolation Precautions No active isolations  Medications Medications  fentaNYL (SUBLIMAZE) injection 100 mcg (100 mcg Intravenous Given 02/01/18 0405)  fentaNYL (SUBLIMAZE) injection 100 mcg (has no administration in time range)  artificial tears (LACRILUBE) ophthalmic ointment 1 application (1 application Both Eyes Given 02/01/18 0723)  cisatracurium (NIMBEX) 200 mg in sodium chloride 0.9 % 200 mL (1 mg/mL) infusion (3.5 mcg/kg/min  72.6 kg Intravenous Rate/Dose Change 02/01/18 0851)  propofol (DIPRIVAN) 1000 MG/100ML infusion (80 mcg/kg/min  72.6 kg Intravenous New Bag/Given 02/01/18 0723)  enoxaparin (LOVENOX) injection 40 mg (has no administration in time range)  pantoprazole sodium (PROTONIX) 40 mg/20 mL oral suspension 40 mg (has no administration in time range)  dextrose 5 %-0.45 % sodium chloride infusion (has no administration in time range)  acetaminophen (TYLENOL) tablet 650 mg (has no administration in time range)  ondansetron (ZOFRAN) injection 4 mg (has no administration in time range)  ondansetron (ZOFRAN) injection 4 mg (4 mg Intravenous Given 02/01/18 0303)  rocuronium (ZEMURON) injection 43.6 mg (43.6 mg Intravenous Given 02/01/18 0356)  0.9 %  sodium chloride infusion (1,000 mL/hr Intravenous New Bag/Given 02/01/18 0335)  etomidate (AMIDATE) injection (20 mg Intravenous Given 02/01/18 0336)  succinylcholine (ANECTINE) injection (100 mg Intravenous Given 02/01/18 0337)  rocuronium (ZEMURON) injection 43.6 mg (43.6 mg Intravenous Given 02/01/18 0446)  cisatracurium (NIMBEX) bolus via infusion 7.3 mg (7.3 mg Intravenous Bolus from Bag 02/01/18 0628)    Mobility walks

## 2018-02-01 NOTE — Sedation Documentation (Signed)
X-ray at bedside

## 2018-02-02 ENCOUNTER — Encounter (HOSPITAL_COMMUNITY): Payer: Self-pay

## 2018-02-02 DIAGNOSIS — F10921 Alcohol use, unspecified with intoxication delirium: Secondary | ICD-10-CM

## 2018-02-02 DIAGNOSIS — T50904D Poisoning by unspecified drugs, medicaments and biological substances, undetermined, subsequent encounter: Secondary | ICD-10-CM

## 2018-02-02 DIAGNOSIS — Z9911 Dependence on respirator [ventilator] status: Secondary | ICD-10-CM

## 2018-02-02 LAB — BASIC METABOLIC PANEL
Anion gap: 7 (ref 5–15)
BUN: 17 mg/dL (ref 6–20)
CO2: 22 mmol/L (ref 22–32)
Calcium: 8.1 mg/dL — ABNORMAL LOW (ref 8.9–10.3)
Chloride: 111 mmol/L (ref 98–111)
Creatinine, Ser: 1.01 mg/dL (ref 0.61–1.24)
GFR calc non Af Amer: >60 mL/min (ref >60)
Glucose, Bld: 90 mg/dL (ref 70–99)
POTASSIUM: 3.5 mmol/L (ref 3.5–5.1)
SODIUM: 140 mmol/L (ref 135–145)

## 2018-02-02 LAB — CBC
HEMATOCRIT: 36.2 % — AB (ref 39.0–52.0)
HEMOGLOBIN: 12.2 g/dL — AB (ref 13.0–17.0)
MCH: 31.3 pg (ref 26.0–34.0)
MCHC: 33.7 g/dL (ref 30.0–36.0)
MCV: 92.8 fL (ref 78.0–100.0)
Platelets: 214 10*3/uL (ref 150–400)
RBC: 3.9 MIL/uL — AB (ref 4.22–5.81)
RDW: 13.7 % (ref 11.5–15.5)
WBC: 11.5 10*3/uL — ABNORMAL HIGH (ref 4.0–10.5)

## 2018-02-02 LAB — URINALYSIS, ROUTINE W REFLEX MICROSCOPIC
Bacteria, UA: NONE SEEN
Bilirubin Urine: NEGATIVE
GLUCOSE, UA: NEGATIVE mg/dL
HGB URINE DIPSTICK: NEGATIVE
Ketones, ur: NEGATIVE mg/dL
LEUKOCYTES UA: NEGATIVE
Nitrite: NEGATIVE
PH: 5 (ref 5.0–8.0)
Protein, ur: 100 mg/dL — AB
SPECIFIC GRAVITY, URINE: 1.039 — AB (ref 1.005–1.030)

## 2018-02-02 LAB — NA AND K (SODIUM & POTASSIUM), RAND UR
Potassium Urine: 147 mmol/L
Sodium, Ur: 122 mmol/L

## 2018-02-02 LAB — PROTEIN / CREATININE RATIO, URINE
Creatinine, Urine: 612.29 mg/dL
Protein Creatinine Ratio: 0.08 mg/mg{creat} (ref 0.00–0.15)
Total Protein, Urine: 50 mg/dL

## 2018-02-02 LAB — HIV ANTIBODY (ROUTINE TESTING W REFLEX): HIV Screen 4th Generation wRfx: NONREACTIVE

## 2018-02-02 LAB — PHOSPHORUS: PHOSPHORUS: 2.8 mg/dL (ref 2.5–4.6)

## 2018-02-02 LAB — MAGNESIUM: MAGNESIUM: 1.7 mg/dL (ref 1.7–2.4)

## 2018-02-02 LAB — PROTEIN, URINE, RANDOM: Total Protein, Urine: 48 mg/dL

## 2018-02-02 LAB — CK: Total CK: 664 U/L — ABNORMAL HIGH (ref 49–397)

## 2018-02-02 LAB — CREATININE, URINE, RANDOM

## 2018-02-02 MED ORDER — PROMETHAZINE HCL 25 MG PO TABS
25.0000 mg | ORAL_TABLET | Freq: Once | ORAL | Status: AC
  Administered 2018-02-02: 25 mg via ORAL
  Filled 2018-02-02: qty 1

## 2018-02-02 MED ORDER — ORAL CARE MOUTH RINSE
15.0000 mL | OROMUCOSAL | Status: DC
  Administered 2018-02-02 – 2018-02-03 (×7): 15 mL via OROMUCOSAL

## 2018-02-02 MED ORDER — PHENOL 1.4 % MT LIQD
1.0000 | OROMUCOSAL | Status: DC | PRN
  Administered 2018-02-02: 1 via OROMUCOSAL
  Filled 2018-02-02: qty 177

## 2018-02-02 MED ORDER — CHLORHEXIDINE GLUCONATE 0.12% ORAL RINSE (MEDLINE KIT)
15.0000 mL | Freq: Two times a day (BID) | OROMUCOSAL | Status: DC
  Administered 2018-02-02: 15 mL via OROMUCOSAL

## 2018-02-02 NOTE — Progress Notes (Signed)
NAME:  Jaime Butler, MRN:  [id number], DOB:  1991/04/04, LOS: 1 ADMISSION DATE:  02/01/2018, CONSULTATION DATE: 02/01/2018 REFERRING MD: Dr. Read Drivers, CHIEF COMPLAINT: Drug overdose  Brief History   27 year old polysubstance abuse overdose related to alcohol, ecstasy, benzos, methamphetamines, THC, intubated for airway protection and agitation.  Past Medical History  Past medical history of alcohol abuse, chest pain, depression  Significant Hospital Events   02/01/2018: Intubated, sedated with fentanyl, propofol and paralyzed with Nimbex 02/02/2018: Extubated  Consults: date of consult/date signed off & final recs:  02/01/2018: Critical care  Procedures (surgical and bedside):  02/01/2018: Endotracheally intubated 02/01/2018: Head CT negative 02/01/2018: Chest x-ray, tracheal tube in place, lung fields clear  Significant Diagnostic Tests:  Urine drug screen: Positive for amphetamines, benzodiazepines, THC Blood alcohol level: 259  Micro Data:  2019.  MRSA PCR negative  Antimicrobials:  None  Subjective:  Patient seen this morning on rounds.  Care discussed with nursing staff at bedside.  Family friend at bedside who states that the patient lives with her for several years even though she is not his biological mother.  She cares for him as such.  We did discover that he is still legally married and is separated from his wife.  He does have a girlfriend.  Overall doing well this morning off sedation passing SBT/SAT  Objective   Blood pressure 122/81, pulse 97, temperature (!) 97.5 F (36.4 C), temperature source Oral, resp. rate 20, height 5\' 10"  (1.778 m), weight 72.6 kg, SpO2 98 %.    Vent Mode: PRVC FiO2 (%):  [30 %] 30 % Set Rate:  [16 bmp] 16 bmp Vt Set:  [580 mL] 580 mL PEEP:  [5 cmH20] 5 cmH20 Plateau Pressure:  [21 cmH20-28 cmH20] 28 cmH20   Intake/Output Summary (Last 24 hours) at 02/02/2018 1022 Last data filed at 02/02/2018 0950 Gross per 24 hour  Intake 2506.49 ml    Output 535 ml  Net 1971.49 ml   Filed Weights   02/01/18 0351 02/01/18 0351  Weight: 72.6 kg 72.6 kg    Examination: General appearance: 27 y.o., male, intubated on mechanical ventilation, following commands Eyes: anicteric sclerae, moist conjunctivae; PERRLA, tracking appropriately HENT: NCAT; oropharynx, endotracheal tube in place Neck: Trachea midline; supple, lymphadenopathy, no JVD Lungs: CTAB, no crackles, no wheeze, with normal respiratory effort and no intercostal retractions CV: RRR, S1, S2, no MRGs  Abdomen: Soft, non-tender; non-distended, BS present  Extremities: No peripheral edema or radial and DP pulses present bilaterally  Skin: Normal temperature, turgor and texture; no rash Psych: Unable to fully assess, no evidence of active delirium Neuro: Alert and oriented to person and place, no focal deficit, following commands  Resolved Hospital Problem list   Agitation, delirium-resolved  Assessment & Plan:   Acute hypoxemic respiratory failure secondary to inability to maintain secure airway setting of acute metabolic toxic encephalopathy from a polysubstance overdose to include the following drugs: Ecstasy, amphetamines, benzodiazepines, THC, alcohol, acute alcohol intoxication -Patient was intubated and sedated and even paralyzed for some time yesterday. -This point all of his sedation is off he is following commands appropriately in the acute intoxication has resolved. -Suspect he will do well with liberation from the ventilator she is passing his SBT and SAT. -Chest x-ray from yesterday reviewed, no infiltrate. - The patient's images have been independently reviewed by me.    Coke colored urine, I suspect this is could be myoglobinuria, from Rhabdomyolysis  -We will check urinalysis as well as serum CK -We  will need to monitor his renal function -Some urine discoloration can be seen in patients receiving propofol.  Propofol infusion syndrome is also a possibility  however he was not on propofol for very long and he does not have a significant acidosis.  Psychiatric needs, drug addiction, depression -No clear etiology to this was a suicide attempt.  However after listening to the story of him recently losing the ability to see his children as well as losing his job last week I believe the patient should be seen by psychiatry   Disposition / Summary of Today's Plan 02/02/18   Pending extubation     Diet: once extubate will advance as tolerated  Pain/Anxiety/Delirium protocol (if indicated): d/c meds, planning extubation  VAP protocol (if indicated): yes DVT prophylaxis: lovenox GI prophylaxis: PPI  Hyperglycemia protocol: n/a Mobility: bedrest, will advance  Code Status: FULL  Family Communication: family friend updated   Labs   CBC: Recent Labs  Lab 02/01/18 0317 02/02/18 0331  WBC 10.7* 11.5*  NEUTROABS 6.8  --   HGB 14.8 12.2*  HCT 43.2 36.2*  MCV 91.9 92.8  PLT 269 214    Basic Metabolic Panel: Recent Labs  Lab 02/01/18 0317 02/02/18 0331  NA 147* 140  K 3.9 3.5  CL 110 111  CO2 25 22  GLUCOSE 93 90  BUN 14 17  CREATININE 0.90 1.01  CALCIUM 9.2 8.1*  MG  --  1.7  PHOS  --  2.8   GFR: Estimated Creatinine Clearance: 110.8 mL/min (by C-G formula based on SCr of 1.01 mg/dL). Recent Labs  Lab 02/01/18 0317 02/02/18 0331  WBC 10.7* 11.5*    Liver Function Tests: Recent Labs  Lab 02/01/18 0317  AST 29  ALT 19  ALKPHOS 55  BILITOT 0.6  PROT 8.2*  ALBUMIN 4.6   No results for input(s): LIPASE, AMYLASE in the last 168 hours. No results for input(s): AMMONIA in the last 168 hours.  ABG    Component Value Date/Time   PHART 7.455 (H) 02/01/2018 0525   PCO2ART 34.6 02/01/2018 0525   PO2ART 523 (H) 02/01/2018 0525   HCO3 24.2 02/01/2018 0525   TCO2 32 07/07/2012 1335   O2SAT 99.7 02/01/2018 0525     Coagulation Profile: No results for input(s): INR, PROTIME in the last 168 hours.  Cardiac  Enzymes: Recent Labs  Lab 02/01/18 1029  TROPONINI <0.03    HbA1C: No results found for: HGBA1C  CBG: No results for input(s): GLUCAP in the last 168 hours.  Admitting History of Present Illness.   27 year old admitted via ED with extreme agitation after consuming alcohol and unknown amount of ecstasy.  Received benzos in route urine toxicology positive for amphetamines, benzodiazepines and THC, alcohol level 259.  Required intubation for airway protection and deep sedation and paralysis for extreme agitation . He had minimal blood with emesis and dry heaving.  5 mg of Versed IM was given by EMS.  In the ED he complained of dyspnea and then became unresponsive and was intubated for airway protection.  He received 200 mics of fentanyl in divided doses for agitation and then deep sedation with propofol but remained agitated and finally required a paralytic/Nimbex drip after rocuronium x1 was administered   This patient is critically ill with multiple organ system failure; which, requires frequent high complexity decision making, assessment, support, evaluation, and titration of therapies. This was completed through the application of advanced monitoring technologies and extensive interpretation of multiple databases. During this encounter  critical care time was devoted to patient care services described in this note for 36 minutes.   Josephine Igo, DO [facility] 02/02/2018 11:53 AM  Personal pager: [phone] If unanswered, please page CCM On-call: #[phone]

## 2018-02-02 NOTE — Procedures (Signed)
Extubation Procedure Note  Patient Details:   Name: Jaime Butler DOB: [birth year] MRN: [id number]   Airway Documentation:    Vent end date: 02/02/18 Vent end time: 1120   Evaluation  O2 sats: 95 Complications: none Patient tolerated procedure well. Bilateral Breath Sounds: Clear   Pt able to speak.  Per CCM order, pt extubated and placed on nasal cannula.  Tolerrated well, no complications.   02/02/2018, 11:30 AM

## 2018-02-03 DIAGNOSIS — T50901D Poisoning by unspecified drugs, medicaments and biological substances, accidental (unintentional), subsequent encounter: Secondary | ICD-10-CM

## 2018-02-03 DIAGNOSIS — J9601 Acute respiratory failure with hypoxia: Secondary | ICD-10-CM

## 2018-02-03 MED ORDER — ACETAMINOPHEN 325 MG PO TABS: 650.0000 mg | ORAL_TABLET | ORAL | Status: AC | PRN

## 2018-02-03 NOTE — Care Management Note (Signed)
Case Management Note  Patient Details  Name: Jaime Butler MRN: [id number] Date of Birth: 1991-01-09  Subjective/Objective:                  Discharge to home with self care  Action/Plan: No cm needs  Expected Discharge Date:  02/03/18               Expected Discharge Plan:  Home/Self Care  In-House Referral:     Discharge planning Services  CM Consult  Post Acute Care Choice:    Choice offered to:     DME Arranged:    DME Agency:     HH Arranged:    HH Agency:     Status of Service:  Completed, signed off  If discussed at [employer] of Stay Meetings, dates discussed:    Additional Comments:  Golda Acre, RN 02/03/2018, 10:15 AM

## 2018-02-03 NOTE — Clinical Social Work Note (Signed)
Clinical Social Work Assessment  Patient Details  Name: Jaime Butler MRN: [id number] Date of Birth: [birth year]  Date of referral:  02/03/18               Reason for consult:  Substance Use/ETOH Abuse                Permission sought to share information with:    Permission granted to share information::     Name::        Agency::  SNF  Relationship::     Contact Information:     Housing/Transportation Living arrangements for the past 2 months:  Single Family Home Source of Information:  Patient Patient Interpreter Needed:  None Criminal Activity/Legal Involvement Pertinent to Current Situation/Hospitalization:  No - Comment as needed Significant Relationships:  Significant Other Lives with:  Self Do you feel safe going back to the place where you live?  Yes Need for family participation in patient care:  No (Coment)  Care giving concerns:   Substance Use  Social Worker assessment / plan: CSW met with the patient at bedside to discuss substance use concerns. Patient was receptive to CSW discussing these concerns. CSW discussed intensive outpatient/ outpatient and residential treatment centers. Patient declines going to a treatment center at this time. Patient reports with his work schedule and taking care of his children he does not have time to commit to another program. Patient reports he is currently active with [facility]. Patient reports he was sober for the past nine months until this past weekend when he drank alcohol and took ectasy (for first time) in celebration of his birthday. Patient reports "almost dying is enough to make me stop for good." Patient reports he plans to reach out this sponsor as soon as possible.   CSW left a list of additional treatment options for the patient to reference.  Plan: Patient will follow up with AA group.   Employment status:  [employer] information:  Self Pay (Medicaid Pending) PT Recommendations:  Not assessed at  this time Information / Referral to community resources:  Outpatient Substance Abuse Treatment Options, Residential Substance Abuse Treatment Options  Patient/Family's Response to care: Patient appreciative of CSW resources.   Patient/Family's Understanding of and Emotional Response to Diagnosis, Current Treatment, and Prognosis:  " I will take it one day at time." Patient has a good understanding of his diagnosis. He plans to follow up with his AA treatment group.  Emotional Assessment Appearance:  Developmentally appropriate, Appears stated age Attitude/Demeanor/Rapport:    Affect (typically observed):  Accepting Orientation:  Oriented to Self, Oriented to  Time, Oriented to Place, Oriented to Situation Alcohol / Substance use:  Alcohol Use, Illicit Drugs Psych involvement (Current and /or in the community):  No (Comment)  Discharge Needs  Concerns to be addressed:  Substance Abuse Concerns Readmission within the last 30 days:  No Current discharge risk:  Substance Abuse Barriers to Discharge:  No Barriers Identified   Lia Hopping, LCSW 02/03/2018, 12:39 PM

## 2018-02-03 NOTE — Progress Notes (Signed)
Pt. Complaining of blister near right IV. IV taken out and foam covered. MD aware.

## 2018-02-03 NOTE — Discharge Instructions (Signed)
Alcohol Intoxication °Alcohol intoxication occurs when a person no longer thinks clearly or functions well (becomes impaired) after drinking alcohol. Intoxication can occur with even one drink. The level of impairment depends on: °· The amount of alcohol the person had. °· The person's age, gender, and weight. °· How often the person drinks. °· Whether the person has other medical conditions, such as diabetes, seizures, or a heart condition. ° °Alcohol intoxication can range in severity from mild to severe. The condition can be dangerous, especially when caused by drinking large amounts of alcohol in a short period of time (binge drinking) or if the person also took certain prescription medicines or recreational drugs. °What are the signs or symptoms? °Symptoms of mild alcohol intoxication include: °· Feeling relaxed or sleepy. °· Mild difficulty with: °? Coordination. °? Speech. °? Memory. °? Attention. ° °Symptoms of moderate alcohol intoxication include: °· Extreme emotions, such as anger or sadness. °· Moderate difficulty with: °? Coordination. °? Speech. °? Memory. °? Attention. ° °Symptoms of severe alcohol intoxication include: °· Passing out. °· Vomiting. °· Confusion. °· Slow breathing. °· Coma. °· Severe difficulty with: °? Coordination. °? Speech. °? Memory. °? Attention. ° °Intoxication, especially in people who are not exposed to alcohol often can progress from mild to severe quickly, and may even cause coma or death. °How is this diagnosed? °This condition may be diagnosed based on: °· A medical history. °· A physical exam. °· A blood test that measures the concentration of alcohol in the blood (blood alcohol content, or BAC). °· Whether there is a smell of alcohol on the breath. ° °Your health care provider will ask you how much alcohol you drank and what kind of alcohol you had. °How is this treated? °Usually, treatment is not needed for this condition. Most of the effects of alcohol are temporary  and go away as the alcohol naturally leaves the body. Your health care provider may recommend monitoring until the alcohol level starts to drop and it is safe to go home. You may also get fluids through an IV tube to help prevent dehydration. If the intoxication is severe, a breathing machine called a ventilator may be needed to support your breathing. °Follow these instructions at home: °· Do not drive after drinking alcohol. °· Have someone stay with you while you are intoxicated. You should not be left alone. °· Stay hydrated. Drink enough fluid to keep your urine clear or pale yellow. °· Avoid caffeine because it can dehydrate you. °· Take over-the-counter and prescription medicines only as told by your health care provider. °How is this prevented? °To prevent alcohol intoxication: °· Limit alcohol intake to no more than 1 drink a day for nonpregnant women and 2 drinks a day for men. One drink equals 12 oz of beer, 5 oz of wine, or 1½ oz of hard liquor. °· Do not drink alcohol on an empty stomach. °· Avoid drinking alcohol if: °? You are under the legal drinking age. °? You are pregnant or may be pregnant. °? You are taking medicines that should not be taken with alcohol. °? Your drinking causes your medical condition to get worse. °? You need to drive or perform activities that require your attention. °? You have substance use disorder. ° °To prevent potentially serious complications of alcohol intoxication, seek immediate medical care if you or someone you know has signs of moderate or severe alcohol intoxication. These include: °· Moderate or severe difficulty with: °? Coordination. °? Speech. °?   Memory. °? Attention. °· Passing out. °· Confusion. °· Vomiting. ° °Do not leave someone alone if he or she is intoxicated. °Contact a health care provider if: °· You do not feel better after a few days. °· You are having problems at work, at school, or at home due to drinking. °Get help right away if: °· You become  shaky when you try to stop drinking. °· You shake uncontrollably (have a seizure). °· You vomit blood. Blood in vomit may look bright red, or it may look like coffee grounds. °· You have blood in your stool. Blood in stool may be bright red, or it may make stool appear black and tarry and make it smell bad. °· You become light-headed or you faint. °If you ever feel like you may hurt yourself or others, or have thoughts about taking your own life, get help right away. You can go to your nearest emergency department or call: °· Your local emergency services (911 in the [place] °· A suicide crisis helpline, such as the National Suicide Prevention Lifeline at 1-800-273-8255. This is open 24 hours a day. ° °This information is not intended to replace advice given to you by your health care provider. Make sure you discuss any questions you have with your health care provider. °Document Released: 01/30/2005 Document Revised: 01/06/2016 Document Reviewed: 01/06/2016 °[employer]vier Interactive Patient Education © 2018 [employer] ° °

## 2018-02-03 NOTE — Discharge Summary (Addendum)
Physician Discharge Summary         Patient ID: Jaime Butler MRN: [id number] DOB/AGE: July 16, 1990 27 y.o.  Admit date: 02/01/2018 Discharge date: 02/03/2018  Discharge Diagnoses:    Acute toxic encephalopathy Polysubstance overdose Alcohol intoxication  Ineffective airway clearance requiring mechanical ventilation  Mild Rhabdomyolysis    Discharge summary    This is a 27 year old male patient with no significant medical history with the exception of alcohol abuse.  Presented to the emergency room on 9/29 with extreme agitation after consuming alcohol and unknown quantity of ecstasy.  He had received benzodiazepines in route without improvement.  Urine toxicology was positive for amphetamines, benzodiazepines, THC, and alcohol level was recorded at 259.  He was intubated by the emergency room physician due to the amount of medications required to treat his agitation.  He was admitted to the intensive care for detoxification, IV hydration, and supportive care. He was successfully weaned from the mechanical ventilator on 9/30.  Of note he did have coke colored urine and mildly elevated creatinine kinase over 600.  He was treated additionally with IV fluid, his urine has cleared.  Has no evidence of endorgan failure.  On 10/1, IV fluids were discontinued, we ensure that he could tolerate regular diet and ambulate safely.  He denied any desire to harm himself.  He was deemed ready for discharge.  Discharge Plan by Active Problems    Resolved acute toxic encephalopathy due to alcohol intoxication and concomitant use of ecstasy and THC. Plan Instructed to avoid illegal drugs  Mild rhabdomyolysis Plan Encouraged to push p.o. Fluids Avoid nonsteroidal anti-inflammatory drugs    Significant Hospital tests/ studies  Urine toxicology on 9/29: Positive for amphetamines, benzodiazepines, and THC EtOH level 259 Procedures   Endotracheal tube 9/29 through 9/30 Culture data/antimicrobials        Consults      Discharge Exam: Blood Pressure 92/68   Pulse 88   Temperature 99.4 F (37.4 C) (Oral)   Respiration (Abnormal) 23   Height 5\' 10"  (1.778 m)   Weight 63.2 kg   Oxygen Saturation 95%   Body Mass Index 19.99 kg/m   General: This is a pleasant 27 year old male patient resting comfortably in bed he is in no acute distress HEENT normocephalic atraumatic no jugular venous distention mucous membranes are moist Pulmonary: Clear to auscultation no accessory use Cardiac: Regular rate and rhythm without murmur rub or gallop Abdomen: Soft nontender no organomegaly positive bowel sounds GU: Clear yellow urine Neuro: Awake oriented no focal deficits.  Labs at discharge   Lab Results  Component Value Date   CREATININE 1.01 02/02/2018   BUN 17 02/02/2018   NA 140 02/02/2018   K 3.5 02/02/2018   CL 111 02/02/2018   CO2 22 02/02/2018   Lab Results  Component Value Date   WBC 11.5 (H) 02/02/2018   HGB 12.2 (L) 02/02/2018   HCT 36.2 (L) 02/02/2018   MCV 92.8 02/02/2018   PLT 214 02/02/2018   Lab Results  Component Value Date   ALT 19 02/01/2018   AST 29 02/01/2018   ALKPHOS 55 02/01/2018   BILITOT 0.6 02/01/2018   Lab Results  Component Value Date   INR 1.00 08/22/2017    Current radiological studies    No results found.  Disposition:    Discharge disposition: 01-Home or Self Care     home   Discharge Instructions    Diet general   Complete by:  As directed    Discharge instructions  Complete by:  As directed    Avoid illegal drugs Please use alcohol in moderation only NO NSAIDs (ibuprofen, aleve, aspirin). Tylenol for pain is OK.   Increase activity slowly   Complete by:  As directed      Allergies as of 02/03/2018    Allergen Reactions Comment   Bee Venom Anaphylaxis    Peanut-containing Drug Products Anaphylaxis       Medication List    Take these medications   acetaminophen 325 MG tablet Commonly known as:  TYLENOL Take 2  tablets (650 mg total) by mouth every 4 (four) hours as needed for mild pain (temp > 101.5).      Follow-up Information    primary doctor Follow up in 1 week(s).   Why:  call if new fever, shortness of breath. productive cough w/ discolored sputum change in urine color           Discharge Condition:    good  Physician Statement:   The Patient was personally examined, the discharge assessment and plan has been personally reviewed and I agree with ACNP Tavyn Kurka's assessment and plan. 32 minutes of time have been dedicated to discharge assessment, planning and discharge instructions.   Signed: Shelby Mattocks 02/03/2018, 9:18 AM
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
## 2020-09-23 IMAGING — DX DG CHEST 1V PORT
1 series · 1 of 1 positions shown · non-contrast
Comparison: None.

CLINICAL DATA: 27-year-old male status post intubation and NG tube
placement.

EXAM:
PORTABLE CHEST 1 VIEW

[chest ap]
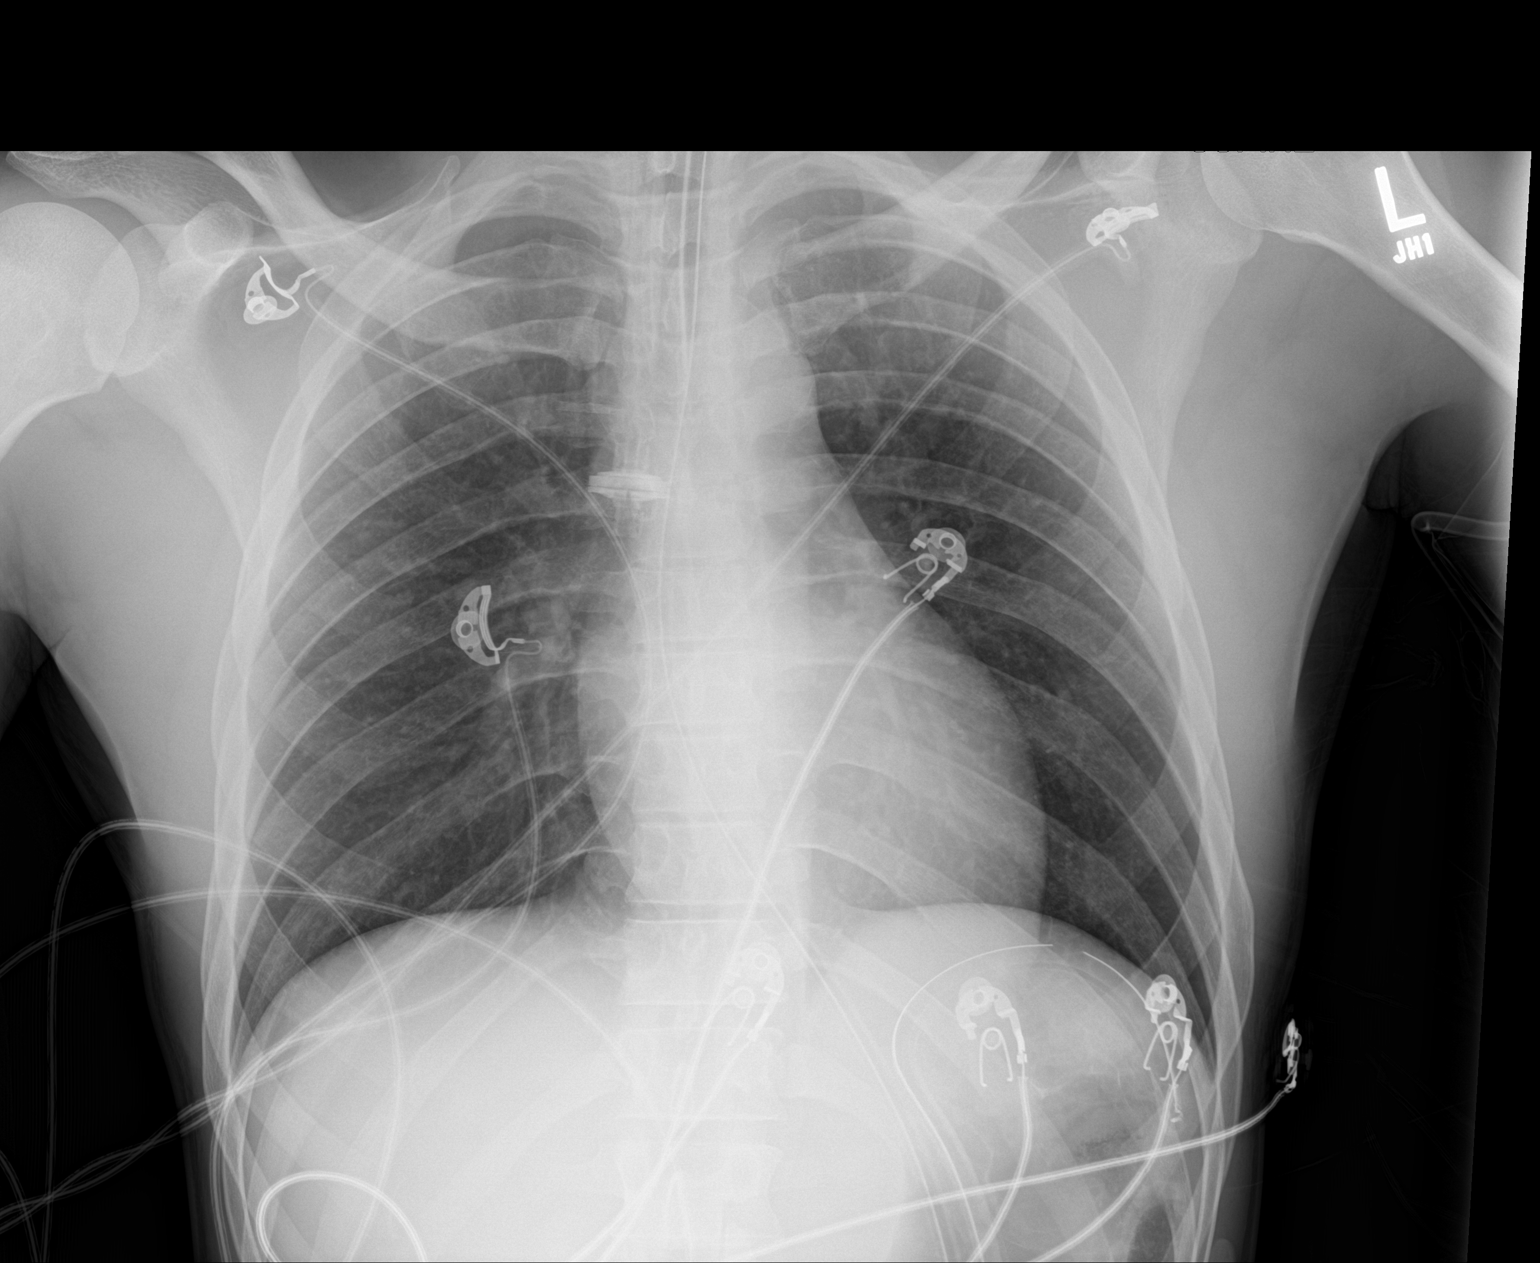

[1 of 1 positions shown; findings below may reference images not displayed]

FINDINGS: Endotracheal tube with tip approximately 15 mm above the carina.
Recommend retraction by approximately 2-3 cm for optimal
positioning. Enteric tube extends below the diaphragm and makes a
single loop in the stomach with tip in the gastric fundus.

The lungs are clear. There is no pleural effusion or pneumothorax.
The cardiac silhouette is within normal limits.

No bowel dilatation or evidence of obstruction. No free air. The
osseous structures and soft tissues are grossly unremarkable.
IMPRESSION: Endotracheal tube approximately 15 mm above the carina and enteric
tube the tip in the gastric fundus.

## 2021-09-03 ENCOUNTER — Ambulatory Visit: Payer: BC Managed Care – PPO | Admitting: Internal Medicine

## 2021-10-05 ENCOUNTER — Ambulatory Visit: Payer: BC Managed Care – PPO | Admitting: Family Medicine

## 2021-12-05 ENCOUNTER — Ambulatory Visit: Payer: BC Managed Care – PPO | Admitting: Family Medicine
